# Patient Record
Sex: Male | Born: 1970 | Race: Black or African American | Hispanic: No | Marital: Married | Smoking: Never smoker
Health system: Southern US, Community
[De-identification: ages and names within clinical notes are randomized; demographics above are authoritative.]

## PROBLEM LIST (undated history)

## (undated) DIAGNOSIS — K589 Irritable bowel syndrome without diarrhea: Secondary | ICD-10-CM

## (undated) DIAGNOSIS — E119 Type 2 diabetes mellitus without complications: Secondary | ICD-10-CM

## (undated) DIAGNOSIS — I2510 Atherosclerotic heart disease of native coronary artery without angina pectoris: Secondary | ICD-10-CM

## (undated) DIAGNOSIS — I10 Essential (primary) hypertension: Secondary | ICD-10-CM

## (undated) HISTORY — PX: KNEE ARTHROSCOPY: SHX127

---

## 2008-05-15 ENCOUNTER — Ambulatory Visit: Payer: Self-pay | Admitting: Interventional Radiology

## 2008-05-15 ENCOUNTER — Emergency Department (HOSPITAL_BASED_OUTPATIENT_CLINIC_OR_DEPARTMENT_OTHER): Admission: EM | Admit: 2008-05-15 | Discharge: 2008-05-15 | Payer: Self-pay | Admitting: Emergency Medicine

## 2010-06-21 LAB — URINALYSIS, ROUTINE W REFLEX MICROSCOPIC
Bilirubin Urine: NEGATIVE
Nitrite: NEGATIVE
Specific Gravity, Urine: 1.014 (ref 1.005–1.030)
pH: 7.5 (ref 5.0–8.0)

## 2010-06-21 LAB — DIFFERENTIAL
Basophils Relative: 0 % (ref 0–1)
Lymphs Abs: 2 10*3/uL (ref 0.7–4.0)
Monocytes Relative: 7 % (ref 3–12)
Neutro Abs: 2.8 10*3/uL (ref 1.7–7.7)
Neutrophils Relative %: 52 % (ref 43–77)

## 2010-06-21 LAB — CBC
HCT: 48.6 % (ref 39.0–52.0)
MCV: 85 fL (ref 78.0–100.0)
Platelets: 232 10*3/uL (ref 150–400)
RDW: 13.1 % (ref 11.5–15.5)

## 2010-06-21 LAB — COMPREHENSIVE METABOLIC PANEL
BUN: 9 mg/dL (ref 6–23)
Calcium: 8.4 mg/dL (ref 8.4–10.5)
Glucose, Bld: 87 mg/dL (ref 70–99)
Total Protein: 7.5 g/dL (ref 6.0–8.3)

## 2010-06-21 LAB — URINE CULTURE: Colony Count: NO GROWTH

## 2014-10-12 ENCOUNTER — Encounter (HOSPITAL_BASED_OUTPATIENT_CLINIC_OR_DEPARTMENT_OTHER): Payer: Self-pay | Admitting: Emergency Medicine

## 2014-10-12 ENCOUNTER — Emergency Department (HOSPITAL_BASED_OUTPATIENT_CLINIC_OR_DEPARTMENT_OTHER)
Admission: EM | Admit: 2014-10-12 | Discharge: 2014-10-12 | Disposition: A | Discharge status: Home / Self Care | Payer: 59 | Attending: Emergency Medicine | Admitting: Emergency Medicine

## 2014-10-12 DIAGNOSIS — I2510 Atherosclerotic heart disease of native coronary artery without angina pectoris: Secondary | ICD-10-CM | POA: Diagnosis not present

## 2014-10-12 DIAGNOSIS — I10 Essential (primary) hypertension: Secondary | ICD-10-CM | POA: Diagnosis not present

## 2014-10-12 DIAGNOSIS — K0889 Other specified disorders of teeth and supporting structures: Secondary | ICD-10-CM

## 2014-10-12 DIAGNOSIS — Z79899 Other long term (current) drug therapy: Secondary | ICD-10-CM | POA: Diagnosis not present

## 2014-10-12 DIAGNOSIS — K088 Other specified disorders of teeth and supporting structures: Secondary | ICD-10-CM | POA: Diagnosis present

## 2014-10-12 DIAGNOSIS — K0381 Cracked tooth: Secondary | ICD-10-CM | POA: Insufficient documentation

## 2014-10-12 DIAGNOSIS — Z880 Allergy status to penicillin: Secondary | ICD-10-CM | POA: Diagnosis not present

## 2014-10-12 DIAGNOSIS — K029 Dental caries, unspecified: Secondary | ICD-10-CM | POA: Insufficient documentation

## 2014-10-12 HISTORY — DX: Essential (primary) hypertension: I10

## 2014-10-12 HISTORY — DX: Atherosclerotic heart disease of native coronary artery without angina pectoris: I25.10

## 2014-10-12 MED ORDER — CLINDAMYCIN HCL 150 MG PO CAPS
450.0000 mg | ORAL_CAPSULE | Freq: Once | ORAL | Status: AC
  Administered 2014-10-12: 450 mg via ORAL
  Filled 2014-10-12: qty 3

## 2014-10-12 MED ORDER — BUPIVACAINE HCL (PF) 0.25 % IJ SOLN
10.0000 mL | Freq: Once | INTRAMUSCULAR | Status: AC
  Administered 2014-10-12: 10 mL
  Filled 2014-10-12: qty 10

## 2014-10-12 MED ORDER — CLINDAMYCIN HCL 150 MG PO CAPS: 450.0000 mg | ORAL_CAPSULE | Freq: Three times a day (TID) | ORAL | Status: DC

## 2014-10-12 MED ORDER — BUPIVACAINE-EPINEPHRINE (PF) 0.5% -1:200000 IJ SOLN
INTRAMUSCULAR | Status: AC
  Filled 2014-10-12: qty 1.8

## 2014-10-12 MED ORDER — HYDROCODONE-ACETAMINOPHEN 5-325 MG PO TABS: 1.0000 | ORAL_TABLET | Freq: Four times a day (QID) | ORAL | Status: DC | PRN

## 2014-10-12 NOTE — Discharge Instructions (Signed)

## 2014-10-12 NOTE — ED Notes (Signed)
Left posterior lower tooth pain x 3 days

## 2014-10-12 NOTE — ED Provider Notes (Signed)
CSN: [id number]     Arrival date & time 10/12/14  0331 History   First MD Initiated Contact with Patient 10/12/14 0445     Chief Complaint  Patient presents with  . Dental Pain     Patient is a 44 y.o. male presenting with tooth pain. The history is provided by the patient. No language interpreter was used.  Dental Pain  [name] for evaluation of dental pain. He states that he's had throbbing pain in his lower left and upper left jaw for the last 3 days. He broke his teeth in that area about a year ago. The pain is constant and excruciating in nature. It is nonradiating. Denies any fevers, chest pain, shortness of breath, sore throat, difficulty swallowing, numbness, weakness. He plans to see his dentist later today.  Past Medical History  Diagnosis Date  . Coronary artery disease   . Hypertension    History reviewed. No pertinent past surgical history. History reviewed. No pertinent family history. History  Substance Use Topics  . Smoking status: Never Smoker   . Smokeless tobacco: Current User    Types: Chew  . Alcohol Use: No    Review of Systems  All other systems reviewed and are negative.     Allergies  Penicillins  Home Medications   Prior to Admission medications   Medication Sig Start Date End Date Taking? Authorizing Provider  ibuprofen (ADVIL,MOTRIN) 800 MG tablet Take 800 mg by mouth every 8 (eight) hours as needed.   Yes Historical Provider, MD  lisinopril (PRINIVIL,ZESTRIL) 20 MG tablet Take 20 mg by mouth daily.   Yes Historical Provider, MD  potassium chloride (KLOR-CON) 20 MEQ packet Take by mouth 2 (two) times daily.   Yes Historical Provider, MD  pravastatin (PRAVACHOL) 20 MG tablet Take 20 mg by mouth daily.   Yes Historical Provider, MD   BP 155/97 mmHg  Pulse 80  Temp(Src) 98.2 F (36.8 C) (Oral)  Resp 20  Ht 5\' 7"  (1.702 m)  Wt 220 lb (99.791 kg)  BMI 34.45 kg/m2  SpO2 96% Physical Exam  Constitutional: He is oriented to  person, place, and time. He appears well-developed and well-nourished.  HENT:  Head: Normocephalic and atraumatic.   Left TM obscured by cerumen. Right TM is clear. There is extensive dental decay with multiple fractured teeth at the base, predominantly in the left lower  Molars. There is mild gingival swelling in this area. There is no focal fluctuance. No trismus.  Cardiovascular: Normal rate and regular rhythm.   No murmur heard. Pulmonary/Chest: Effort normal. No respiratory distress.   No stridor  Neurological: He is alert and oriented to person, place, and time.  Skin: Skin is warm and dry.  Psychiatric: He has a normal mood and affect. His behavior is normal.  Nursing note and vitals reviewed.   ED Course  Procedures (including critical care time) NERVE BLOCK Performed by: Tilden Fossa Consent: Verbal consent obtained. Required items: required blood products, implants, devices, and special equipment available Time out: Immediately prior to procedure a "time out" was called to verify the correct patient, procedure, equipment, support staff and site/side marked as required.  Indication: dental pain Nerve block body site: left inferior alveolar nerve  Preparation: Patient was prepped and draped in the usual sterile fashion. Needle gauge: 27 G Location technique: anatomical landmarks  Local anesthetic: bupivicaine  Anesthetic total: 0.5 ml  Outcome: pain improved Patient tolerance: Patient tolerated the procedure well with no immediate complications.  Labs Review Labs Reviewed - No data to display  Imaging Review No results found.   EKG Interpretation None      MDM   Final diagnoses:  Pain, dental    patient here for evaluation of dental pain. History of presentation is not consistent with ACS or dissection. He does have poor dentition in the area of pain. Treating for possible developing dental abscess with clindamycin. Perform dental block with partial  improvement in his pain. Discussed dentistry follow-up as well as return precautions.    Tilden Fossa, MD 10/12/14 [phone]

## 2014-10-15 ENCOUNTER — Emergency Department (HOSPITAL_BASED_OUTPATIENT_CLINIC_OR_DEPARTMENT_OTHER)
Admission: EM | Admit: 2014-10-15 | Discharge: 2014-10-15 | Disposition: A | Discharge status: Home / Self Care | Payer: 59 | Attending: Emergency Medicine | Admitting: Emergency Medicine

## 2014-10-15 ENCOUNTER — Encounter (HOSPITAL_BASED_OUTPATIENT_CLINIC_OR_DEPARTMENT_OTHER): Payer: Self-pay

## 2014-10-15 DIAGNOSIS — K088 Other specified disorders of teeth and supporting structures: Secondary | ICD-10-CM | POA: Diagnosis not present

## 2014-10-15 DIAGNOSIS — K029 Dental caries, unspecified: Secondary | ICD-10-CM | POA: Insufficient documentation

## 2014-10-15 DIAGNOSIS — I10 Essential (primary) hypertension: Secondary | ICD-10-CM | POA: Insufficient documentation

## 2014-10-15 DIAGNOSIS — G8929 Other chronic pain: Secondary | ICD-10-CM

## 2014-10-15 DIAGNOSIS — I2510 Atherosclerotic heart disease of native coronary artery without angina pectoris: Secondary | ICD-10-CM | POA: Diagnosis not present

## 2014-10-15 DIAGNOSIS — Z79899 Other long term (current) drug therapy: Secondary | ICD-10-CM | POA: Insufficient documentation

## 2014-10-15 DIAGNOSIS — Z880 Allergy status to penicillin: Secondary | ICD-10-CM | POA: Diagnosis not present

## 2014-10-15 DIAGNOSIS — K089 Disorder of teeth and supporting structures, unspecified: Secondary | ICD-10-CM

## 2014-10-15 MED ORDER — HYDROCODONE-ACETAMINOPHEN 5-325 MG PO TABS: ORAL_TABLET | ORAL | Status: DC

## 2014-10-15 NOTE — ED Notes (Addendum)
Patient here for further evaluation of left sided dental pain. No relief with antibiotics and requesting additonal pain medication

## 2014-10-15 NOTE — ED Provider Notes (Signed)
CSN: [id number]     Arrival date & time 10/15/14  1247 History   First MD Initiated Contact with Patient 10/15/14 1247     Chief Complaint  Patient presents with  . Dental Pain     (Consider location/radiation/quality/duration/timing/severity/associated sxs/prior Treatment) HPI   Blood pressure 145/76, pulse 72, temperature 98.1 F (36.7 C), temperature source Oral, resp. rate 18, height  (1.702 m), weight 225 lb (102.059 kg), SpO2 96 %.  Richard Le is a 44 y.o. male complaining of persistent, 8 out of 10 left lower dental plane. Patient was seen here for similar but has ran out of his Vicodin. States he's been compliant with his clindamycin but states "that's not helping the pain." Denies fever/chills, difficulty opening jaw, difficulty swallowing, SOB, gum swelling, facial swelling, neck swelling chest pain, shortness of breath, diaphoresis, increasing peripheral edema, calf pain, leg swelling, orthopnea, PND.Marland Kitchen    Past Medical History  Diagnosis Date  . Coronary artery disease   . Hypertension    History reviewed. No pertinent past surgical history. No family history on file. History  Substance Use Topics  . Smoking status: Never Smoker   . Smokeless tobacco: Current User    Types: Chew  . Alcohol Use: No    Review of Systems  10 systems reviewed and found to be negative, except as noted in the HPI.   Allergies  Penicillins  Home Medications   Prior to Admission medications   Medication Sig Start Date End Date Taking? Authorizing Provider  clindamycin (CLEOCIN) 150 MG capsule Take 3 capsules (450 mg total) by mouth 3 (three) times daily. 10/12/14   Tilden Fossa, MD  HYDROcodone-acetaminophen (NORCO/VICODIN) 5-325 MG per tablet Take 1 tablet by mouth every 6 (six) hours as needed. 10/12/14   Tilden Fossa, MD  ibuprofen (ADVIL,MOTRIN) 800 MG tablet Take 800 mg by mouth every 8 (eight) hours as needed.    Historical Provider, MD  lisinopril (PRINIVIL,ZESTRIL) 20  MG tablet Take 20 mg by mouth daily.    Historical Provider, MD  potassium chloride (KLOR-CON) 20 MEQ packet Take by mouth 2 (two) times daily.    Historical Provider, MD  pravastatin (PRAVACHOL) 20 MG tablet Take 20 mg by mouth daily.    Historical Provider, MD   BP 145/76 mmHg  Pulse 72  Temp(Src) 98.1 F (36.7 C) (Oral)  Resp 18  Ht  (1.702 m)  Wt 225 lb (102.059 kg)  BMI 35.23 kg/m2  SpO2 96% Physical Exam  Constitutional: He is oriented to person, place, and time. He appears well-developed and well-nourished. No distress.  HENT:  Head: Normocephalic.  Mouth/Throat: Oropharynx is clear and moist.    Generally poor dentition, no gingival swelling, erythema or tenderness to palpation. Patient is handling their secretions. There is no tenderness to palpation or firmness underneath tongue bilaterally. No trismus.    Eyes: Conjunctivae and EOM are normal. Pupils are equal, round, and reactive to light.  Neck: Normal range of motion.  Cardiovascular: Normal rate.   Pulmonary/Chest: Effort normal. No stridor.  Abdominal: Soft.  Musculoskeletal: Normal range of motion.  Lymphadenopathy:    He has no cervical adenopathy.  Neurological: He is alert and oriented to person, place, and time.  Psychiatric: He has a normal mood and affect.  Nursing note and vitals reviewed.   ED Course  Procedures (including critical care time) Labs Review Labs Reviewed - No data to display  Imaging Review No results found.   EKG Interpretation None  MDM   Final diagnoses:  Chronic dental pain    Filed Vitals:   10/15/14 1252  BP: 145/76  Pulse: 72  Temp: 98.1 F (36.7 C)  TempSrc: Oral  Resp: 18  Height: 5\' 7"  (1.702 m)  Weight: 225 lb (102.059 kg)  SpO2: 96%    Richard Le is a pleasant 44 y.o. male presenting with dental pain. Patient seen for similar several days ago, he was given a prescription for clindamycin and Vicodin he has run out of Vicodin. He states he  clindamycin is not helping with the pain. He has an appointment to follow with a dentist in 3 days. His dental pain associated with dental caries but no signs or symptoms of dental abscess. Patient afebrile, non toxic appearing and swallowing secretions well. I gave patient referral to dentist and stressed the importance of dental follow up for definitive management of dental issues. Patient voices understanding and is agreeable to plan.  Evaluation does not show pathology that would require ongoing emergent intervention or inpatient treatment. Pt is hemodynamically stable and mentating appropriately. Discussed findings and plan with patient/guardian, who agrees with care plan. All questions answered. Return precautions discussed and outpatient follow up given.   New Prescriptions   HYDROCODONE-ACETAMINOPHEN (NORCO/VICODIN) 5-325 MG PER TABLET    Take 1-2 tablets by mouth every 6 hours as needed for pain and/or cough.         Wynetta Emery, PA-C 10/15/14 1302  Doug Sou, MD 10/15/14 [phone]

## 2014-10-15 NOTE — Discharge Instructions (Signed)
Take vicodin for breakthrough pain, do not drink alcohol, drive, care for children or do other critical tasks while taking vicodin. ° °Return to the emergency room for fever, change in vision, redness to the face that rapidly spreads towards the eye, nausea or vomiting, difficulty swallowing or shortness of breath. °  °Apply warm compresses to jaw throughout the day.  ° °Take your antibiotics as directed and to the end of the course.  ° °Followup with a dentist is very important for ongoing evaluation and management of recurrent dental pain. Return to emergency department for emergent changing or worsening symptoms." ° °Low-cost dental clinic: °**David  Civils  at [phone]**  °**Janna Civils at [phone] [address]**   ° °You may also call [phone] ° °Dental Assistance °If the dentist on-call cannot see you, please use the resources below: ° ° °Patients with Medicaid: [facility] °[address], [phone] °[address], [phone] ° °If unable to pay, or uninsured, contact [facility] ([phone]) or [facility] ([phone] in [place], [phone] in [place]) to become qualified for the adult dental clinic ° °Other Low-Cost Community Dental Services: °[facility]- [address], [place], [place], [address] °   [phone], Ext. [phone] °   2nd and 4th Thursday of the month at 6:30am °   10 clients each day by appointment, can sometimes see walk-in     patients if someone does not show for an appointment °[facility]- [address], [place], [place], [address] °   [phone] °[facility]- [address], [place], [place], [address] °   [phone] ° °[facility]- [phone] °[facility]- [phone] °[facility]- [phone] ° °

## 2015-02-04 ENCOUNTER — Emergency Department (HOSPITAL_BASED_OUTPATIENT_CLINIC_OR_DEPARTMENT_OTHER)
Admission: EM | Admit: 2015-02-04 | Discharge: 2015-02-04 | Disposition: A | Discharge status: Home / Self Care | Payer: 59 | Attending: Emergency Medicine | Admitting: Emergency Medicine

## 2015-02-04 ENCOUNTER — Encounter (HOSPITAL_BASED_OUTPATIENT_CLINIC_OR_DEPARTMENT_OTHER): Payer: Self-pay | Admitting: Adult Health

## 2015-02-04 ENCOUNTER — Emergency Department (HOSPITAL_BASED_OUTPATIENT_CLINIC_OR_DEPARTMENT_OTHER): Payer: 59

## 2015-02-04 DIAGNOSIS — J40 Bronchitis, not specified as acute or chronic: Secondary | ICD-10-CM

## 2015-02-04 DIAGNOSIS — M545 Low back pain: Secondary | ICD-10-CM | POA: Insufficient documentation

## 2015-02-04 DIAGNOSIS — Z79899 Other long term (current) drug therapy: Secondary | ICD-10-CM | POA: Insufficient documentation

## 2015-02-04 DIAGNOSIS — I10 Essential (primary) hypertension: Secondary | ICD-10-CM | POA: Diagnosis not present

## 2015-02-04 DIAGNOSIS — Z880 Allergy status to penicillin: Secondary | ICD-10-CM | POA: Insufficient documentation

## 2015-02-04 DIAGNOSIS — I2510 Atherosclerotic heart disease of native coronary artery without angina pectoris: Secondary | ICD-10-CM | POA: Diagnosis not present

## 2015-02-04 DIAGNOSIS — Z792 Long term (current) use of antibiotics: Secondary | ICD-10-CM | POA: Insufficient documentation

## 2015-02-04 DIAGNOSIS — R091 Pleurisy: Secondary | ICD-10-CM

## 2015-02-04 DIAGNOSIS — R05 Cough: Secondary | ICD-10-CM | POA: Diagnosis present

## 2015-02-04 MED ORDER — ALBUTEROL SULFATE HFA 108 (90 BASE) MCG/ACT IN AERS
2.0000 | INHALATION_SPRAY | RESPIRATORY_TRACT | Status: DC | PRN
  Administered 2015-02-04: 2 via RESPIRATORY_TRACT
  Filled 2015-02-04: qty 6.7

## 2015-02-04 MED ORDER — BENZONATATE 100 MG PO CAPS: 100.0000 mg | ORAL_CAPSULE | Freq: Three times a day (TID) | ORAL | Status: DC

## 2015-02-04 NOTE — ED Notes (Signed)
Pt ambulated on RA SPO2 94-98%. No distress noted. Pt back in room on pulse ox. spo2 99% HR 81. RR 20

## 2015-02-04 NOTE — ED Notes (Signed)
Presents with 2 days of cough, left back pain. Reports coughing up yellow and green phlegm and feeling hot flashes. "I believe I have pneumonia, it feels like it. I have had it before" breath sounds clear. Breathing easilyt and speaking in full sentences

## 2015-02-04 NOTE — ED Provider Notes (Signed)
CSN: [id number]     Arrival date & time 02/04/15  2003 History   First MD Initiated Contact with Patient 02/04/15 2053     Chief Complaint  Patient presents with  . Cough     (Consider location/radiation/quality/duration/timing/severity/associated sxs/prior Treatment) HPI Comments: Patient presents with complaint of cough and left-sided back pain for the past 3 days. Patient had a subjective fever several nights ago but no documented fevers. Cough is productive of yellow and green sputum. Pain is in left middle back and is worse with deep breathing and coughing. No URI symptoms. No wheezing reported. No nausea, vomiting, or diarrhea. Patient states that he has been on albuterol in the past but has never been diagnosed with pneumonia. No chest pains. Patient states that this current symptoms remind him of when he had pneumonia several years ago. Patient denies risk factors for pulmonary embolism including: unilateral leg swelling, history of DVT/PE/other blood clots, use of hormones, recent immobilizations, recent surgery, recent travel (>4hr segment), malignancy, hemoptysis.     Patient is a 44 y.o. male presenting with cough. The history is provided by the patient.  Cough Associated symptoms: no chest pain, no fever, no headaches, no myalgias, no rash, no rhinorrhea and no sore throat     Past Medical History  Diagnosis Date  . Coronary artery disease   . Hypertension    History reviewed. No pertinent past surgical history. History reviewed. No pertinent family history. Social History  Substance Use Topics  . Smoking status: Never Smoker   . Smokeless tobacco: Current User    Types: Chew  . Alcohol Use: No    Review of Systems  Constitutional: Negative for fever.  HENT: Negative for rhinorrhea and sore throat.   Eyes: Negative for redness.  Respiratory: Positive for cough.   Cardiovascular: Negative for chest pain.  Gastrointestinal: Negative for nausea, vomiting, abdominal  pain and diarrhea.  Genitourinary: Negative for dysuria.  Musculoskeletal: Positive for back pain (Left middle back). Negative for myalgias.  Skin: Negative for rash.  Neurological: Negative for headaches.      Allergies  Penicillins  Home Medications   Prior to Admission medications   Medication Sig Start Date End Date Taking? Authorizing Provider  clindamycin (CLEOCIN) 150 MG capsule Take 3 capsules (450 mg total) by mouth 3 (three) times daily. 10/12/14   Tilden FossaElizabeth Rees, MD  HYDROcodone-acetaminophen (NORCO/VICODIN) 5-325 MG per tablet Take 1 tablet by mouth every 6 (six) hours as needed. 10/12/14   Tilden FossaElizabeth Rees, MD  HYDROcodone-acetaminophen (NORCO/VICODIN) 5-325 MG per tablet Take 1-2 tablets by mouth every 6 hours as needed for pain and/or cough. 10/15/14   Nicole Pisciotta, PA-C  ibuprofen (ADVIL,MOTRIN) 800 MG tablet Take 800 mg by mouth every 8 (eight) hours as needed.    Historical Provider, MD  lisinopril (PRINIVIL,ZESTRIL) 20 MG tablet Take 20 mg by mouth daily.    Historical Provider, MD  potassium chloride (KLOR-CON) 20 MEQ packet Take by mouth 2 (two) times daily.    Historical Provider, MD  pravastatin (PRAVACHOL) 20 MG tablet Take 20 mg by mouth daily.    Historical Provider, MD   BP 158/102 mmHg  Pulse 85  Temp(Src) 98.7 F (37.1 C) (Oral)  Resp 18  Ht 5\' 7"  (1.702 m)  Wt 104.327 kg  BMI 36.01 kg/m2  SpO2 94% Physical Exam  Constitutional: He appears well-developed and well-nourished.  HENT:  Head: Normocephalic and atraumatic.  Right Ear: Hearing, tympanic membrane, external ear and ear canal normal.  Left  Ear: Hearing, tympanic membrane, external ear and ear canal normal.  Nose: Nose normal. No mucosal edema or rhinorrhea.  Mouth/Throat: Oropharynx is clear and moist.  Eyes: Conjunctivae are normal. Right eye exhibits no discharge. Left eye exhibits no discharge.  Neck: Normal range of motion. Neck supple.  Cardiovascular: Normal rate, regular rhythm and  normal heart sounds.   No murmur heard. Pulmonary/Chest: Effort normal and breath sounds normal.  Back pain is not reproducible tenderness.  Abdominal: Soft. There is no tenderness. There is no rebound and no guarding.  Musculoskeletal: He exhibits no edema or tenderness.  Neurological: He is alert.  Skin: Skin is warm and dry.  Psychiatric: He has a normal mood and affect.  Nursing note and vitals reviewed.   ED Course  Procedures (including critical care time) Labs Review Labs Reviewed - No data to display  Imaging Review Dg Chest 2 View  02/04/2015  CLINICAL DATA:  Two day history of cough, left back pain, hot flashes. EXAM: CHEST  2 VIEW COMPARISON:  09/20/2014 FINDINGS: Shallow inspiration with linear atelectasis in the lung bases. Normal heart size and pulmonary vascularity. No focal airspace disease or consolidation in the lungs. No blunting of costophrenic angles. No pneumothorax. Mediastinal contours appear intact. No significant change since previous study. IMPRESSION: Shallow inspiration with linear atelectasis in the lung bases. No evidence of active pulmonary disease. Electronically Signed   By: Burman Nieves M.D.   On: 02/04/2015 21:09   I have personally reviewed and evaluated these images and lab results as part of my medical decision-making.   EKG Interpretation None       9:37 PM Patient seen and examined. Medications ordered. Patient informed of x-ray results. He has pulse oximetry of 94-95% in the room. Will ambulate to make sure he doesn't desaturate. Low suspicion for PE or other etiology at this point. Will treat for bronchitis.  Vital signs reviewed and are as follows: BP 158/102 mmHg  Pulse 85  Temp(Src) 98.7 F (37.1 C) (Oral)  Resp 18  Ht  (1.702 m)  Wt 104.327 kg  BMI 36.01 kg/m2  SpO2 94%  10:00 PM patient has ambulated without decrease in oxygen saturation. Will discharge to home with treatment for bronchitis.  Encourage patient to  return with worsening shortness of breath, fever, blood in sputum, difficulty breathing or other concerns.  Patient counseled on use of albuterol HFA. Instructed to use 1-2 puffs q 4 hours as needed for SOB.   MDM   Final diagnoses:  Bronchitis  Pleurisy   Patient with cough, no documented fevers. Chest x-ray is clear. He describes pleuritic pain in left middle back. He does not have a significant risk factor profile for PE and is PERC negative. He is ambulatory without oxygen desaturation. He is in no respiratory distress. He has no chest pain or other complaints which suggest ACS. Feel treatment with conservative measures with albuterol, Tessalon indicated. Return instructions as above.    Renne Crigler, PA-C 02/04/15 2204  Mirian Mo, MD 02/04/15 2245

## 2015-02-04 NOTE — Discharge Instructions (Signed)
Please read and follow all provided instructions.  Your diagnoses today include:  1. Bronchitis   2. Pleurisy     Tests performed today include:  Chest x-ray - does not show any pneumonia  Vital signs. See below for your results today.   Medications prescribed:   Albuterol inhaler - medication that opens up your airway  Use inhaler as follows: 1-2 puffs with spacer every 4 hours as needed for wheezing, cough, or shortness of breath.    Tessalon Perles - cough suppressant medication  Take any prescribed medications only as directed.  Home care instructions:  Follow any educational materials contained in this packet.  Follow-up instructions: Please follow-up with your primary care provider in the next 3 days for further evaluation of your symptoms and a recheck if you are not feeling better.   Return instructions:   Please return to the Emergency Department if you experience worsening symptoms.  Please return with worsening wheezing, shortness of breath, or difficulty breathing.  Return with persistent fever above 101F.   Please return if you have any other emergent concerns.  Additional Information:  Your vital signs today were: BP 158/102 mmHg   Pulse 82   Temp(Src) 98.7 F (37.1 C) (Oral)   Resp 18   Ht 5\' 7"  (1.702 m)   Wt 104.327 kg   BMI 36.01 kg/m2   SpO2 96% If your blood pressure (BP) was elevated above 135/85 this visit, please have this repeated by your doctor within one month. --------------

## 2015-06-05 ENCOUNTER — Emergency Department (HOSPITAL_BASED_OUTPATIENT_CLINIC_OR_DEPARTMENT_OTHER)
Admission: EM | Admit: 2015-06-05 | Discharge: 2015-06-05 | Disposition: A | Discharge status: Home / Self Care | Payer: 59 | Attending: Emergency Medicine | Admitting: Emergency Medicine

## 2015-06-05 ENCOUNTER — Encounter (HOSPITAL_BASED_OUTPATIENT_CLINIC_OR_DEPARTMENT_OTHER): Payer: Self-pay | Admitting: *Deleted

## 2015-06-05 DIAGNOSIS — I10 Essential (primary) hypertension: Secondary | ICD-10-CM | POA: Diagnosis not present

## 2015-06-05 DIAGNOSIS — Z880 Allergy status to penicillin: Secondary | ICD-10-CM | POA: Insufficient documentation

## 2015-06-05 DIAGNOSIS — I2510 Atherosclerotic heart disease of native coronary artery without angina pectoris: Secondary | ICD-10-CM | POA: Insufficient documentation

## 2015-06-05 DIAGNOSIS — A084 Viral intestinal infection, unspecified: Secondary | ICD-10-CM | POA: Diagnosis not present

## 2015-06-05 DIAGNOSIS — Z79899 Other long term (current) drug therapy: Secondary | ICD-10-CM | POA: Diagnosis not present

## 2015-06-05 DIAGNOSIS — Z792 Long term (current) use of antibiotics: Secondary | ICD-10-CM | POA: Insufficient documentation

## 2015-06-05 DIAGNOSIS — R197 Diarrhea, unspecified: Secondary | ICD-10-CM | POA: Diagnosis present

## 2015-06-05 LAB — CBC WITH DIFFERENTIAL/PLATELET
Basophils Absolute: 0 10*3/uL (ref 0.0–0.1)
Basophils Relative: 0 %
Eosinophils Absolute: 0 10*3/uL (ref 0.0–0.7)
Eosinophils Relative: 1 %
HEMATOCRIT: 49.6 % (ref 39.0–52.0)
Hemoglobin: 17.1 g/dL — ABNORMAL HIGH (ref 13.0–17.0)
LYMPHS ABS: 0.6 10*3/uL — AB (ref 0.7–4.0)
LYMPHS PCT: 8 %
MCH: 28.5 pg (ref 26.0–34.0)
MCHC: 34.5 g/dL (ref 30.0–36.0)
MCV: 82.7 fL (ref 78.0–100.0)
MONO ABS: 0.2 10*3/uL (ref 0.1–1.0)
MONOS PCT: 3 %
NEUTROS ABS: 6.5 10*3/uL (ref 1.7–7.7)
Neutrophils Relative %: 88 %
Platelets: 197 10*3/uL (ref 150–400)
RBC: 6 MIL/uL — ABNORMAL HIGH (ref 4.22–5.81)
RDW: 16.2 % — AB (ref 11.5–15.5)
WBC: 7.4 10*3/uL (ref 4.0–10.5)

## 2015-06-05 LAB — COMPREHENSIVE METABOLIC PANEL
ALT: 38 U/L (ref 17–63)
AST: 32 U/L (ref 15–41)
Albumin: 4.4 g/dL (ref 3.5–5.0)
Alkaline Phosphatase: 88 U/L (ref 38–126)
Anion gap: 11 (ref 5–15)
BILIRUBIN TOTAL: 0.8 mg/dL (ref 0.3–1.2)
BUN: 17 mg/dL (ref 6–20)
CO2: 26 mmol/L (ref 22–32)
Calcium: 8.9 mg/dL (ref 8.9–10.3)
Chloride: 101 mmol/L (ref 101–111)
Creatinine, Ser: 1.08 mg/dL (ref 0.61–1.24)
GFR calc Af Amer: >60 mL/min (ref >60)
Glucose, Bld: 102 mg/dL — ABNORMAL HIGH (ref 65–99)
POTASSIUM: 3.7 mmol/L (ref 3.5–5.1)
Sodium: 138 mmol/L (ref 135–145)
TOTAL PROTEIN: 7.9 g/dL (ref 6.5–8.1)

## 2015-06-05 LAB — URINALYSIS, ROUTINE W REFLEX MICROSCOPIC
BILIRUBIN URINE: NEGATIVE
GLUCOSE, UA: NEGATIVE mg/dL
HGB URINE DIPSTICK: NEGATIVE
Ketones, ur: NEGATIVE mg/dL
Leukocytes, UA: NEGATIVE
Nitrite: NEGATIVE
Protein, ur: NEGATIVE mg/dL
SPECIFIC GRAVITY, URINE: 1.021 (ref 1.005–1.030)
pH: 6 (ref 5.0–8.0)

## 2015-06-05 LAB — LIPASE, BLOOD: LIPASE: 16 U/L (ref 11–51)

## 2015-06-05 MED ORDER — ONDANSETRON 4 MG PO TBDP: ORAL_TABLET | ORAL | Status: DC

## 2015-06-05 MED ORDER — SODIUM CHLORIDE 0.9 % IV BOLUS (SEPSIS)
1000.0000 mL | Freq: Once | INTRAVENOUS | Status: AC
  Administered 2015-06-05: 1000 mL via INTRAVENOUS

## 2015-06-05 MED ORDER — ONDANSETRON HCL 4 MG/2ML IJ SOLN
4.0000 mg | Freq: Once | INTRAMUSCULAR | Status: AC
  Administered 2015-06-05: 4 mg via INTRAVENOUS
  Filled 2015-06-05: qty 2

## 2015-06-05 NOTE — ED Provider Notes (Signed)
CSN: [id number]     Arrival date & time 06/05/15  1606 History   First MD Initiated Contact with Patient 06/05/15 1748     Chief Complaint  Patient presents with  . Emesis  . Diarrhea     (Consider location/radiation/quality/duration/timing/severity/associated sxs/prior Treatment) Patient is a 45 y.o. male presenting with vomiting and diarrhea. The history is provided by the patient. No language interpreter was used.  Emesis Severity:  Moderate Duration:  6 hours Timing:  Intermittent Quality:  Bilious material Chronicity:  New Ineffective treatments:  None tried Associated symptoms: abdominal pain and diarrhea   Associated symptoms: no chills and no fever   Risk factors: no prior abdominal surgery and no sick contacts   Diarrhea Associated symptoms: abdominal pain and vomiting   Associated symptoms: no chills and no fever     Past Medical History  Diagnosis Date  . Coronary artery disease   . Hypertension    History reviewed. No pertinent past surgical history. No family history on file. Social History  Substance Use Topics  . Smoking status: Never Smoker   . Smokeless tobacco: Current User    Types: Chew  . Alcohol Use: No    Review of Systems  Constitutional: Negative for fever and chills.  Gastrointestinal: Positive for vomiting, abdominal pain and diarrhea.  All other systems reviewed and are negative.     Allergies  Penicillins  Home Medications   Prior to Admission medications   Medication Sig Start Date End Date Taking? Authorizing Provider  benzonatate (TESSALON) 100 MG capsule Take 1 capsule (100 mg total) by mouth every 8 (eight) hours. 02/04/15   Renne Crigler, PA-C  clindamycin (CLEOCIN) 150 MG capsule Take 3 capsules (450 mg total) by mouth 3 (three) times daily. 10/12/14   Tilden Fossa, MD  HYDROcodone-acetaminophen (NORCO/VICODIN) 5-325 MG per tablet Take 1 tablet by mouth every 6 (six) hours as needed. 10/12/14   Tilden Fossa, MD   HYDROcodone-acetaminophen (NORCO/VICODIN) 5-325 MG per tablet Take 1-2 tablets by mouth every 6 hours as needed for pain and/or cough. 10/15/14   Nicole Pisciotta, PA-C  ibuprofen (ADVIL,MOTRIN) 800 MG tablet Take 800 mg by mouth every 8 (eight) hours as needed.    Historical Provider, MD  lisinopril (PRINIVIL,ZESTRIL) 20 MG tablet Take 20 mg by mouth daily.    Historical Provider, MD  potassium chloride (KLOR-CON) 20 MEQ packet Take by mouth 2 (two) times daily.    Historical Provider, MD  pravastatin (PRAVACHOL) 20 MG tablet Take 20 mg by mouth daily.    Historical Provider, MD   BP 144/92 mmHg  Pulse 110  Temp(Src) 98.4 F (36.9 C) (Oral)  Resp 18  Ht  (1.702 m)  Wt 104.327 kg  BMI 36.01 kg/m2  SpO2 95% Physical Exam  Constitutional: He is oriented to person, place, and time. He appears well-developed and well-nourished.  HENT:  Head: Normocephalic.  Eyes: Conjunctivae are normal.  Neck: Neck supple.  Cardiovascular: Normal rate and regular rhythm.   Pulmonary/Chest: Effort normal and breath sounds normal.  Abdominal: Bowel sounds are normal. He exhibits distension.  Musculoskeletal: He exhibits no edema or tenderness.  Lymphadenopathy:    He has no cervical adenopathy.  Neurological: He is alert and oriented to person, place, and time.  Skin: Skin is warm and dry.  Psychiatric: He has a normal mood and affect.  Nursing note and vitals reviewed.   ED Course  Procedures (including critical care time) Labs Review Labs Reviewed  CBC WITH DIFFERENTIAL/PLATELET -  Abnormal; Notable for the following:    RBC 6.00 (*)    Hemoglobin 17.1 (*)    RDW 16.2 (*)    Lymphs Abs 0.6 (*)    All other components within normal limits  COMPREHENSIVE METABOLIC PANEL - Abnormal; Notable for the following:    Glucose, Bld 102 (*)    All other components within normal limits  LIPASE, BLOOD  URINALYSIS, ROUTINE W REFLEX MICROSCOPIC (NOT [facility])    Imaging Review No results found. I  have personally reviewed and evaluated these images and lab results as part of my medical decision-making.   EKG Interpretation None     Patient feels better after IV fluids. Is tolerating po fluids without difficulty. Labs results reviewed and shared with patient. MDM   Final diagnoses:  None    Viral gastroenteritis. Symptomatic care instructions provided. Return precautions discussed.    Felicie Mornavid Amna Welker, NP 06/06/15 0121  Richardean Canalavid H Yao, MD 06/06/15 [phone]

## 2015-06-05 NOTE — ED Notes (Signed)
Vomiting and diarrhea x 5 hours.

## 2015-06-05 NOTE — ED Notes (Signed)
Pt verbalizes understanding of d/c instructions and denies any further needs at this time. 

## 2015-06-05 NOTE — Discharge Instructions (Signed)

## 2015-06-05 NOTE — ED Notes (Signed)
Pt sipping ginger ale and states he is ready to go home

## 2015-12-09 ENCOUNTER — Emergency Department (HOSPITAL_BASED_OUTPATIENT_CLINIC_OR_DEPARTMENT_OTHER): Payer: 59

## 2015-12-09 ENCOUNTER — Encounter (HOSPITAL_BASED_OUTPATIENT_CLINIC_OR_DEPARTMENT_OTHER): Payer: Self-pay | Admitting: Emergency Medicine

## 2015-12-09 ENCOUNTER — Emergency Department (HOSPITAL_BASED_OUTPATIENT_CLINIC_OR_DEPARTMENT_OTHER)
Admission: EM | Admit: 2015-12-09 | Discharge: 2015-12-09 | Disposition: A | Discharge status: Home / Self Care | Payer: 59 | Attending: Emergency Medicine | Admitting: Emergency Medicine

## 2015-12-09 DIAGNOSIS — I2510 Atherosclerotic heart disease of native coronary artery without angina pectoris: Secondary | ICD-10-CM | POA: Insufficient documentation

## 2015-12-09 DIAGNOSIS — I10 Essential (primary) hypertension: Secondary | ICD-10-CM | POA: Diagnosis not present

## 2015-12-09 DIAGNOSIS — F17220 Nicotine dependence, chewing tobacco, uncomplicated: Secondary | ICD-10-CM | POA: Diagnosis not present

## 2015-12-09 DIAGNOSIS — Z79899 Other long term (current) drug therapy: Secondary | ICD-10-CM | POA: Insufficient documentation

## 2015-12-09 DIAGNOSIS — J40 Bronchitis, not specified as acute or chronic: Secondary | ICD-10-CM | POA: Insufficient documentation

## 2015-12-09 DIAGNOSIS — R0602 Shortness of breath: Secondary | ICD-10-CM | POA: Diagnosis present

## 2015-12-09 MED ORDER — BENZONATATE 100 MG PO CAPS: 100.0000 mg | ORAL_CAPSULE | Freq: Three times a day (TID) | ORAL | 0 refills | Status: DC

## 2015-12-09 MED ORDER — AZITHROMYCIN 250 MG PO TABS: ORAL_TABLET | ORAL | 0 refills | Status: DC

## 2015-12-09 NOTE — ED Notes (Signed)
PA-C at bedside 

## 2015-12-09 NOTE — Discharge Instructions (Signed)
Please read and follow all provided instructions.  Your diagnoses today include:  1. Bronchitis    Tests performed today include:  Chest x-ray - does not show any pneumonia  Vital signs. See below for your results today.   Medications prescribed:   Azithromycin - antibiotic for respiratory infection  You have been prescribed an antibiotic medicine: take the entire course of medicine even if you are feeling better. Stopping early can cause the antibiotic not to work.   Tessalon Perles - cough suppressant medication  Take any prescribed medications only as directed.  Home care instructions:  Follow any educational materials contained in this packet.  Follow-up instructions: Please follow-up with your primary care provider in the next 3 days for further evaluation of your symptoms and a recheck if you are not feeling better.   Return instructions:   Please return to the Emergency Department if you experience worsening symptoms.  Please return with worsening wheezing, shortness of breath, or difficulty breathing.  Return with persistent fever above 101F.   Please return if you have any other emergent concerns.  Additional Information:  Your vital signs today were: BP 128/92 (BP Location: Right Arm)    Pulse 71    Temp 98 F (36.7 C) (Oral)    Resp 16    Ht 5\' 7"  (1.702 m)    Wt 99.8 kg    SpO2 95%    BMI 34.46 kg/m  If your blood pressure (BP) was elevated above 135/85 this visit, please have this repeated by your doctor within one month. --------------

## 2015-12-09 NOTE — ED Provider Notes (Signed)
[facility] Provider Note   CSN: [id number] Arrival date & time: 12/09/15  1729 By signing my name below, I, Bridgette Habermann, attest that this documentation has been prepared under the direction and in the presence of Renne Crigler, PA-C. Electronically Signed: Bridgette Habermann, ED Scribe. 12/09/15. 6:09 PM.  History   Chief Complaint Chief Complaint  Patient presents with  . Shortness of Breath   HPI Comments: Richard Le is a 45 y.o. male who presents to the Emergency Department complaining of rhinorrhea onset 6 days ago. He also has associated productive cough, shortness of breath, and subjective fever. Pt has sore throat secondary to his cough. Pt was seen at [facility] 5 days ago and had a CXR done that came back unremarkable. He diagnosed with sinus infection and given medications (Flonase, Afrin, Robitussin, Claritin) which has provided him no relief. He denies chest pain. No known sick contacts. Patient states that sinus symptoms are now resolved. He does have some lateral rib cage pain with coughing on the right side. Patient went to work today, shortness of breath did not preclude him from performing his activities.   The history is provided by the patient. No language interpreter was used.    Past Medical History:  Diagnosis Date  . Coronary artery disease   . Hypertension     There are no active problems to display for this patient.   History reviewed. No pertinent surgical history.     Home Medications    Prior to Admission medications   Medication Sig Start Date End Date Taking? Authorizing Provider  amlodipine-atorvastatin (CADUET) 10-10 MG tablet Take 1 tablet by mouth daily.   Yes Historical Provider, MD  fluticasone (VERAMYST) 27.5 MCG/SPRAY nasal spray Place 2 sprays into the nose daily.   Yes Historical Provider, MD  lisinopril (PRINIVIL,ZESTRIL) 20 MG tablet Take 20 mg by mouth daily.   Yes Historical Provider, MD  loratadine-pseudoephedrine (CLARITIN-D  12-HOUR) 5-120 MG tablet Take 1 tablet by mouth 2 (two) times daily.   Yes Historical Provider, MD  potassium chloride (KLOR-CON) 20 MEQ packet Take by mouth 2 (two) times daily.   Yes Historical Provider, MD  pravastatin (PRAVACHOL) 20 MG tablet Take 20 mg by mouth daily.   Yes Historical Provider, MD  benzonatate (TESSALON) 100 MG capsule Take 1 capsule (100 mg total) by mouth every 8 (eight) hours. 02/04/15   Renne Crigler, PA-C  clindamycin (CLEOCIN) 150 MG capsule Take 3 capsules (450 mg total) by mouth 3 (three) times daily. 10/12/14   Tilden Fossa, MD  HYDROcodone-acetaminophen (NORCO/VICODIN) 5-325 MG per tablet Take 1 tablet by mouth every 6 (six) hours as needed. 10/12/14   Tilden Fossa, MD  HYDROcodone-acetaminophen (NORCO/VICODIN) 5-325 MG per tablet Take 1-2 tablets by mouth every 6 hours as needed for pain and/or cough. 10/15/14   Nicole Pisciotta, PA-C  ibuprofen (ADVIL,MOTRIN) 800 MG tablet Take 800 mg by mouth every 8 (eight) hours as needed.    Historical Provider, MD  ondansetron (ZOFRAN ODT) 4 MG disintegrating tablet 4mg  ODT q6 hours prn nausea/vomit 06/05/15   Felicie Morn, NP    Family History No family history on file.  Social History Social History  Substance Use Topics  . Smoking status: Never Smoker  . Smokeless tobacco: Current User    Types: Chew  . Alcohol use No     Allergies   Penicillins   Review of Systems Review of Systems  Constitutional: Positive for fever (Subjective).  HENT: Positive for rhinorrhea and sore  throat.   Eyes: Negative for redness.  Respiratory: Positive for cough and shortness of breath.   Cardiovascular: Negative for chest pain.  Gastrointestinal: Negative for abdominal pain, diarrhea, nausea and vomiting.  Genitourinary: Negative for dysuria.  Musculoskeletal: Negative for myalgias.  Skin: Negative for rash.  Neurological: Negative for headaches.    Physical Exam Updated Vital Signs BP 140/91 (BP Location: Right Arm)    Pulse 95   Temp 98 F (36.7 C) (Oral)   Resp 18   Ht 5\' 7"  (1.702 m)   Wt 220 lb (99.8 kg)   SpO2 96%   BMI 34.46 kg/m   Physical Exam  Constitutional: He appears well-developed and well-nourished.  HENT:  Head: Normocephalic and atraumatic.  Right Ear: Tympanic membrane, external ear and ear canal normal.  Left Ear: Tympanic membrane, external ear and ear canal normal.  Nose: Nose normal. No mucosal edema or rhinorrhea.  Mouth/Throat: Uvula is midline and mucous membranes are normal. Mucous membranes are not dry. No trismus in the jaw. No uvula swelling. Posterior oropharyngeal erythema present. No oropharyngeal exudate, posterior oropharyngeal edema or tonsillar abscesses.  Eyes: Conjunctivae are normal. Right eye exhibits no discharge. Left eye exhibits no discharge.  Neck: Normal range of motion. Neck supple.  Cardiovascular: Normal rate, regular rhythm and normal heart sounds.   No murmur heard. Pulmonary/Chest: Effort normal and breath sounds normal. No respiratory distress. He has no wheezes. He has no rales. He exhibits tenderness (Mild, right lateral).  Abdominal: Soft. Bowel sounds are normal. He exhibits no distension. There is no tenderness.  Musculoskeletal: Normal range of motion.  Neurological: He is alert.  Skin: Skin is warm and dry.  Psychiatric: He has a normal mood and affect. His behavior is normal.  Nursing note and vitals reviewed.  ED Treatments / Results  DIAGNOSTIC STUDIES: Oxygen Saturation is 96% on RA, adequate by my interpretation.    COORDINATION OF CARE: 6:06 PM Discussed treatment plan with pt at bedside which includes chest x-ray and pt agreed to plan.  Radiology Dg Chest 2 View  Result Date: 12/09/2015 CLINICAL DATA:  Congestion for 5-6 days.  Nausea and vomiting. EXAM: CHEST  2 VIEW COMPARISON:  Chest x-rays dated 02/04/2015 and 09/20/2014. FINDINGS: Study is hypoinspiratory with crowding of the perihilar and bibasilar bronchovascular  markings. Given the low lung volumes, lungs are clear. No confluent opacity to suggest a developing pneumonia. No pleural effusion or pneumothorax seen. Heart size and mediastinal contours are normal. Osseous structures about the chest are unremarkable. IMPRESSION: Low lung volumes. No active cardiopulmonary disease. No evidence of pneumonia. Electronically Signed   By: Bary Richard M.D.   On: 12/09/2015 18:21    Procedures Procedures (including critical care time)  Medications Ordered in ED Medications - No data to display   Initial Impression / Assessment and Plan / ED Course  I have reviewed the triage vital signs and the nursing notes.  Pertinent labs & imaging results that were available during my care of the patient were reviewed by me and considered in my medical decision making (see chart for details).  Clinical Course   Patient seen and examined. Given that he does have shortness of breath that is worse, will perform chest x-ray to evaluate for developing pneumonia.  Vital signs reviewed and are as follows: BP 128/92 (BP Location: Right Arm)   Pulse 71   Temp 98 F (36.7 C) (Oral)   Resp 16   Ht 5\' 7"  (1.702 m)   Wt  99.8 kg   SpO2 95%   BMI 34.46 kg/m   Chest x-ray is negative. Will give course of azithromycin: I've asked the patient to hold off filling this for the next 48 hours. He will not fill if he starts to feel better. Otherwise, take full course is not feeling better on Monday. Also Tessalon for cough. Patient can take Robitussin if he desires.  Return with worsening chest pain, worsening shortness of breath, fever, blood noted in sputum, or other concerns. He verbalizes understanding and agrees with plan.  Final Clinical Impressions(s) / ED Diagnoses   Final diagnoses:  Bronchitis   Patient with constellation of symptoms that is likely viral. Earlier this week he had what sounds like a sinusitis that is now resolved. Now main complaint is cough and sore  throat from cough. Patient has some mild chest wall tenderness that is reproducible palpation and coughing. No abnormal vital signs. No symptoms that are concerning for angina. Do not feel that cardiac workup is indicated at this point getting clearly infectious etiology. Patient appears well, nontoxic. No hypoxia. Feel safe for discharge home.  I personally performed the services described in this documentation, which was scribed in my presence. The recorded information has been reviewed and is accurate.   New Prescriptions Discharge Medication List as of 12/09/2015  7:02 PM    START taking these medications   Details  azithromycin (ZITHROMAX Z-PAK) 250 MG tablet Take two tablets PO on day 1 and one tablet PO days 2-5, Print         Renne CriglerJoshua Charvi Gammage, PA-C 12/09/15 1929    Laurence Spatesachel Morgan Little, MD 12/09/15 2056

## 2015-12-09 NOTE — ED Triage Notes (Addendum)
[facility].  States medications sent home with are not helping.  Pt c/o SOB and cough.  Lung sounds diminished, no course lung sounds or wheezing heard in triage.

## 2017-12-23 ENCOUNTER — Emergency Department (HOSPITAL_BASED_OUTPATIENT_CLINIC_OR_DEPARTMENT_OTHER)
Admission: EM | Admit: 2017-12-23 | Discharge: 2017-12-23 | Disposition: A | Discharge status: Home / Self Care | Payer: 59 | Attending: Emergency Medicine | Admitting: Emergency Medicine

## 2017-12-23 ENCOUNTER — Encounter (HOSPITAL_BASED_OUTPATIENT_CLINIC_OR_DEPARTMENT_OTHER): Payer: Self-pay | Admitting: Emergency Medicine

## 2017-12-23 ENCOUNTER — Other Ambulatory Visit: Payer: Self-pay

## 2017-12-23 DIAGNOSIS — R109 Unspecified abdominal pain: Secondary | ICD-10-CM | POA: Diagnosis present

## 2017-12-23 DIAGNOSIS — I10 Essential (primary) hypertension: Secondary | ICD-10-CM | POA: Diagnosis not present

## 2017-12-23 DIAGNOSIS — Z79899 Other long term (current) drug therapy: Secondary | ICD-10-CM | POA: Insufficient documentation

## 2017-12-23 DIAGNOSIS — R1084 Generalized abdominal pain: Secondary | ICD-10-CM | POA: Insufficient documentation

## 2017-12-23 DIAGNOSIS — F17220 Nicotine dependence, chewing tobacco, uncomplicated: Secondary | ICD-10-CM | POA: Insufficient documentation

## 2017-12-23 DIAGNOSIS — I2510 Atherosclerotic heart disease of native coronary artery without angina pectoris: Secondary | ICD-10-CM | POA: Diagnosis not present

## 2017-12-23 LAB — COMPREHENSIVE METABOLIC PANEL
ALT: 31 U/L (ref 0–44)
AST: 24 U/L (ref 15–41)
Albumin: 4.1 g/dL (ref 3.5–5.0)
Alkaline Phosphatase: 74 U/L (ref 38–126)
Anion gap: 9 (ref 5–15)
BILIRUBIN TOTAL: 0.5 mg/dL (ref 0.3–1.2)
BUN: 16 mg/dL (ref 6–20)
CO2: 26 mmol/L (ref 22–32)
CREATININE: 0.91 mg/dL (ref 0.61–1.24)
Calcium: 9 mg/dL (ref 8.9–10.3)
Chloride: 102 mmol/L (ref 98–111)
GFR calc Af Amer: >60 mL/min (ref >60)
GFR calc non Af Amer: >60 mL/min (ref >60)
Glucose, Bld: 84 mg/dL (ref 70–99)
POTASSIUM: 3.5 mmol/L (ref 3.5–5.1)
Sodium: 137 mmol/L (ref 135–145)
Total Protein: 7.4 g/dL (ref 6.5–8.1)

## 2017-12-23 LAB — URINALYSIS, ROUTINE W REFLEX MICROSCOPIC
BILIRUBIN URINE: NEGATIVE
GLUCOSE, UA: NEGATIVE mg/dL
Hgb urine dipstick: NEGATIVE
KETONES UR: NEGATIVE mg/dL
Leukocytes, UA: NEGATIVE
Nitrite: NEGATIVE
Protein, ur: NEGATIVE mg/dL
SPECIFIC GRAVITY, URINE: 1.02 (ref 1.005–1.030)
pH: 6 (ref 5.0–8.0)

## 2017-12-23 LAB — CBC
HCT: 46.3 % (ref 39.0–52.0)
HEMOGLOBIN: 14.9 g/dL (ref 13.0–17.0)
MCH: 27.7 pg (ref 26.0–34.0)
MCHC: 32.2 g/dL (ref 30.0–36.0)
MCV: 86.1 fL (ref 80.0–100.0)
Platelets: 195 10*3/uL (ref 150–400)
RBC: 5.38 MIL/uL (ref 4.22–5.81)
RDW: 15 % (ref 11.5–15.5)
WBC: 6.3 10*3/uL (ref 4.0–10.5)
nRBC: 0 % (ref 0.0–0.2)

## 2017-12-23 LAB — LIPASE, BLOOD: Lipase: 28 U/L (ref 11–51)

## 2017-12-23 MED ORDER — DICYCLOMINE HCL 10 MG PO CAPS
10.0000 mg | ORAL_CAPSULE | Freq: Once | ORAL | Status: AC
  Administered 2017-12-23: 10 mg via ORAL
  Filled 2017-12-23: qty 1

## 2017-12-23 MED ORDER — DICYCLOMINE HCL 10 MG PO CAPS: 10.0000 mg | ORAL_CAPSULE | Freq: Three times a day (TID) | ORAL | 0 refills | Status: DC

## 2017-12-23 NOTE — ED Provider Notes (Signed)
[facility] EMERGENCY DEPARTMENT Provider Note   CSN: [id number] Arrival date & time: 12/23/17  1751     History   Chief Complaint Chief Complaint  Patient presents with  . Abdominal Pain    HPI Richard Le is a 47 y.o. male.  The history is provided by the patient. No language interpreter was used.  Abdominal Pain     Richard Le is a 47 y.o. male who presents to the Emergency Department complaining of abdominal pain. He presents to the emergency department complaining of abdominal bloating and pain for the last two days. He feels like he has excess gas and has been experiencing frequent belching as well as loose stools. He has pain to both silence of his lower abdomen. No fevers, nausea, vomiting, dysuria. No known bad food exposures or sick contacts. No prior abdominal surgeries. No prior similar symptoms. He does have a history of IBS. Past Medical History:  Diagnosis Date  . Coronary artery disease   . Hypertension     There are no active problems to display for this patient.   History reviewed. No pertinent surgical history.      Home Medications    Prior to Admission medications   Medication Sig Start Date End Date Taking? Authorizing Provider  amlodipine-atorvastatin (CADUET) 10-10 MG tablet Take 1 tablet by mouth daily.    [provider]  azithromycin (ZITHROMAX Z-PAK) 250 MG tablet Take two tablets PO on day 1 and one tablet PO days 2-5 12/09/15   Renne Crigler, PA-C  benzonatate (TESSALON) 100 MG capsule Take 1 capsule (100 mg total) by mouth every 8 (eight) hours. 12/09/15   Renne Crigler, PA-C  clindamycin (CLEOCIN) 150 MG capsule Take 3 capsules (450 mg total) by mouth 3 (three) times daily. 10/12/14   Tilden Fossa, MD  dicyclomine (BENTYL) 10 MG capsule Take 1 capsule (10 mg total) by mouth 4 (four) times daily -  before meals and at bedtime. 12/23/17   Tilden Fossa, MD  fluticasone (VERAMYST) 27.5 MCG/SPRAY nasal spray  Place 2 sprays into the nose daily.    [provider]  HYDROcodone-acetaminophen (NORCO/VICODIN) 5-325 MG per tablet Take 1 tablet by mouth every 6 (six) hours as needed. 10/12/14   Tilden Fossa, MD  HYDROcodone-acetaminophen (NORCO/VICODIN) 5-325 MG per tablet Take 1-2 tablets by mouth every 6 hours as needed for pain and/or cough. 10/15/14   Pisciotta, Joni Reining, PA-C  ibuprofen (ADVIL,MOTRIN) 800 MG tablet Take 800 mg by mouth every 8 (eight) hours as needed.    [provider]  lisinopril (PRINIVIL,ZESTRIL) 20 MG tablet Take 20 mg by mouth daily.    [provider]  loratadine-pseudoephedrine (CLARITIN-D 12-HOUR) 5-120 MG tablet Take 1 tablet by mouth 2 (two) times daily.    [provider]  ondansetron (ZOFRAN ODT) 4 MG disintegrating tablet 4mg  ODT q6 hours prn nausea/vomit 06/05/15   Felicie Morn, NP  potassium chloride (KLOR-CON) 20 MEQ packet Take by mouth 2 (two) times daily.    [provider]  pravastatin (PRAVACHOL) 20 MG tablet Take 20 mg by mouth daily.    [provider]    Family History History reviewed. No pertinent family history.  Social History Social History   Tobacco Use  . Smoking status: Never Smoker  . Smokeless tobacco: Current User    Types: Chew  Substance Use Topics  . Alcohol use: No  . Drug use: Not on file     Allergies   Penicillins   Review  of Systems Review of Systems  Gastrointestinal: Positive for abdominal pain.  All other systems reviewed and are negative.    Physical Exam Updated Vital Signs BP 139/88 (BP Location: Right Arm)   Pulse 64   Temp 98 F (36.7 C) (Oral)   Resp 18   Ht 5\' 7"  (1.702 m)   Wt 101.2 kg   SpO2 98%   BMI 34.93 kg/m   Physical Exam  Constitutional: He is oriented to person, place, and time. He appears well-developed and well-nourished.  HENT:  Head: Normocephalic and atraumatic.  Cardiovascular: Normal rate and regular rhythm.  No murmur  heard. Pulmonary/Chest: Effort normal and breath sounds normal. No respiratory distress.  Abdominal: Soft. Bowel sounds are normal. There is no rebound and no guarding.  Minimal abdominal distention. Mild generalized abdominal tenderness without guarding or rebound.  Musculoskeletal: He exhibits no edema or tenderness.  Neurological: He is alert and oriented to person, place, and time.  Skin: Skin is warm and dry.  Psychiatric: He has a normal mood and affect. His behavior is normal.  Nursing note and vitals reviewed.    ED Treatments / Results  Labs (all labs ordered are listed, but only abnormal results are displayed) Labs Reviewed  LIPASE, BLOOD  COMPREHENSIVE METABOLIC PANEL  CBC  URINALYSIS, ROUTINE W REFLEX MICROSCOPIC    EKG None  Radiology No results found.  Procedures Procedures (including critical care time)  Medications Ordered in ED Medications  dicyclomine (BENTYL) capsule 10 mg (10 mg Oral Given 12/23/17 2042)     Initial Impression / Assessment and Plan / ED Course  I have reviewed the triage vital signs and the nursing notes.  Pertinent labs & imaging results that were available during my care of the patient were reviewed by me and considered in my medical decision making (see chart for details).     Patient here for evaluation of abdominal bloating, loose stools. He has minimal tenderness on examination. Presentation is not consistent with bowel obstruction, acute appendicitis, perforated viscous, cholecystitis. Counseled patient on home care for abdominal bloating and discomfort. Discussed importance of outpatient follow-up as well as return precautions. Recommend repeat abdominal examination in the next 12 to 24 hours if he does have ongoing symptoms.  Final Clinical Impressions(s) / ED Diagnoses   Final diagnoses:  Abdominal cramping    ED Discharge Orders         Ordered    dicyclomine (BENTYL) 10 MG capsule  3 times daily before meals &  bedtime     12/23/17 2113           Tilden Fossa, MD 12/23/17 2256

## 2017-12-23 NOTE — ED Triage Notes (Signed)
Reports generalized abdominal pain with diarrhea.  States he feels like his stomach is distended.  Denies vomiting, dysuria.

## 2017-12-23 NOTE — Discharge Instructions (Addendum)
Plenty of fluids. Get rechecked immediately if you have worsening abdominal pain, fevers, vomiting or new concerning symptoms. Get re-evaluated with a repeat abdominal exam if you have ongoing pain for the next 12 to 24 hours.

## 2018-08-05 ENCOUNTER — Emergency Department (HOSPITAL_BASED_OUTPATIENT_CLINIC_OR_DEPARTMENT_OTHER)
Admission: EM | Admit: 2018-08-05 | Discharge: 2018-08-05 | Disposition: A | Discharge status: Home / Self Care | Payer: 59 | Attending: Emergency Medicine | Admitting: Emergency Medicine

## 2018-08-05 ENCOUNTER — Encounter (HOSPITAL_BASED_OUTPATIENT_CLINIC_OR_DEPARTMENT_OTHER): Payer: Self-pay | Admitting: *Deleted

## 2018-08-05 ENCOUNTER — Other Ambulatory Visit: Payer: Self-pay

## 2018-08-05 DIAGNOSIS — I10 Essential (primary) hypertension: Secondary | ICD-10-CM | POA: Diagnosis not present

## 2018-08-05 DIAGNOSIS — I2510 Atherosclerotic heart disease of native coronary artery without angina pectoris: Secondary | ICD-10-CM | POA: Insufficient documentation

## 2018-08-05 DIAGNOSIS — R002 Palpitations: Secondary | ICD-10-CM | POA: Diagnosis not present

## 2018-08-05 DIAGNOSIS — F17220 Nicotine dependence, chewing tobacco, uncomplicated: Secondary | ICD-10-CM | POA: Diagnosis not present

## 2018-08-05 LAB — CBC WITH DIFFERENTIAL/PLATELET
Abs Immature Granulocytes: 0.01 10*3/uL (ref 0.00–0.07)
Basophils Absolute: 0 10*3/uL (ref 0.0–0.1)
Basophils Relative: 0 %
Eosinophils Absolute: 0.1 10*3/uL (ref 0.0–0.5)
Eosinophils Relative: 2 %
HCT: 43.4 % (ref 39.0–52.0)
Hemoglobin: 13.9 g/dL (ref 13.0–17.0)
Immature Granulocytes: 0 %
Lymphocytes Relative: 57 %
Lymphs Abs: 4 10*3/uL (ref 0.7–4.0)
MCH: 27.6 pg (ref 26.0–34.0)
MCHC: 32 g/dL (ref 30.0–36.0)
MCV: 86.1 fL (ref 80.0–100.0)
Monocytes Absolute: 0.4 10*3/uL (ref 0.1–1.0)
Monocytes Relative: 6 %
Neutro Abs: 2.4 10*3/uL (ref 1.7–7.7)
Neutrophils Relative %: 35 %
Platelets: 180 10*3/uL (ref 150–400)
RBC: 5.04 MIL/uL (ref 4.22–5.81)
RDW: 15.3 % (ref 11.5–15.5)
WBC: 7 10*3/uL (ref 4.0–10.5)
nRBC: 0 % (ref 0.0–0.2)

## 2018-08-05 LAB — BASIC METABOLIC PANEL
Anion gap: 7 (ref 5–15)
BUN: 17 mg/dL (ref 6–20)
CO2: 25 mmol/L (ref 22–32)
Calcium: 8.4 mg/dL — ABNORMAL LOW (ref 8.9–10.3)
Chloride: 105 mmol/L (ref 98–111)
Creatinine, Ser: 1 mg/dL (ref 0.61–1.24)
GFR calc Af Amer: >60 mL/min (ref >60)
GFR calc non Af Amer: >60 mL/min (ref >60)
Glucose, Bld: 166 mg/dL — ABNORMAL HIGH (ref 70–99)
Potassium: 3.6 mmol/L (ref 3.5–5.1)
Sodium: 137 mmol/L (ref 135–145)

## 2018-08-05 LAB — MAGNESIUM: Magnesium: 2 mg/dL (ref 1.7–2.4)

## 2018-08-05 LAB — TROPONIN I: Troponin I: <0.03 ng/mL (ref <0.03)

## 2018-08-05 MED ORDER — SODIUM CHLORIDE 0.9 % IV BOLUS
500.0000 mL | Freq: Once | INTRAVENOUS | Status: AC
  Administered 2018-08-05: 500 mL via INTRAVENOUS

## 2018-08-05 NOTE — ED Provider Notes (Signed)
Emergency Department Provider Note   I have reviewed the triage vital signs and the nursing notes.   HISTORY  Chief Complaint Palpitations   HPI Richard Le is a 48 y.o. male with PMH of CAD and HTN presents to the emergency department for evaluation of acute onset heart palpitations.  Patient states that symptoms began abruptly this evening without obvious provoking factor.  He denies any chest pain, shortness of breath, lightheadedness.  Symptoms lasted for approximately 2 minutes and resolve spontaneously.  He has no prior history of similar.  He is not taking any new medications, herbal supplements.  He drank a small amount of alcohol earlier in the day which is not unusual for him.  He denies using any illicit drugs.  No radiation of symptoms or other modifying factors.  He is not experiencing any symptoms at this time.  Past Medical History:  Diagnosis Date  . Coronary artery disease   . Hypertension     There are no active problems to display for this patient.   History reviewed. No pertinent surgical history.  Allergies Penicillins  History reviewed. No pertinent family history.  Social History Social History   Tobacco Use  . Smoking status: Never Smoker  . Smokeless tobacco: Current User    Types: Chew  Substance Use Topics  . Alcohol use: No  . Drug use: Not Currently    Review of Systems  Constitutional: No fever/chills Eyes: No visual changes. ENT: No sore throat. Cardiovascular: Denies chest pain. Positive palpitations.  Respiratory: Denies shortness of breath. Gastrointestinal: No abdominal pain.  No nausea, no vomiting.  No diarrhea.  No constipation. Genitourinary: Negative for dysuria. Musculoskeletal: Negative for back pain. Skin: Negative for rash. Neurological: Negative for headaches, focal weakness or numbness.  10-point ROS otherwise negative.  ____________________________________________   PHYSICAL EXAM:  VITAL SIGNS: ED  Triage Vitals  Enc Vitals Group     BP 08/05/18 2050 (!) 169/110     Pulse Rate 08/05/18 2050 (!) 110     Resp 08/05/18 2050 18     Temp 08/05/18 2050 98.8 F (37.1 C)     Temp Source 08/05/18 2050 Oral     SpO2 08/05/18 2050 98 %     Weight 08/05/18 2051 230 lb (104.3 kg)     Height 08/05/18 2051 5\' 7"  (1.702 m)   Constitutional: Alert and oriented. Well appearing and in no acute distress. Eyes: Conjunctivae are normal.  Head: Atraumatic. Nose: No congestion/rhinnorhea. Mouth/Throat: Mucous membranes are moist.   Neck: No stridor.   Cardiovascular: Tachycardia. Good peripheral circulation. Grossly normal heart sounds.   Respiratory: Normal respiratory effort.  No retractions. Lungs CTAB. Gastrointestinal: Soft and nontender. No distention.  Musculoskeletal: No lower extremity tenderness nor edema. No gross deformities of extremities. Neurologic:  Normal speech and language. No gross focal neurologic deficits are appreciated.  Skin:  Skin is warm, dry and intact. No rash noted. Keloid over the anterior chest wall.    ____________________________________________   LABS (all labs ordered are listed, but only abnormal results are displayed)  Labs Reviewed  BASIC METABOLIC PANEL - Abnormal; Notable for the following components:      Result Value   Glucose, Bld 166 (*)    Calcium 8.4 (*)    All other components within normal limits  CBC WITH DIFFERENTIAL/PLATELET  TROPONIN I  MAGNESIUM   ____________________________________________  EKG   EKG Interpretation  Date/Time:  Wednesday Aug 05 2018 20:57:33 EDT Ventricular Rate:  106 PR  Interval:    QRS Duration: 90 QT Interval:  323 QTC Calculation: 429 R Axis:   34 Text Interpretation:  Sinus tachycardia Borderline T abnormalities, inferior leads No STEMI.  Similar to prior.  Confirmed by Alona Bene [phone]) on 08/05/2018 9:23:00 PM Also confirmed by Alona Bene [phone]), editor Barbette Hair [phone])  on 08/06/2018 7:01:50  AM       ____________________________________________  RADIOLOGY  None ____________________________________________   PROCEDURES  Procedure(s) performed:   Procedures  None ____________________________________________   INITIAL IMPRESSION / ASSESSMENT AND PLAN / ED COURSE  Pertinent labs & imaging results that were available during my care of the patient were reviewed by me and considered in my medical decision making (see chart for details).   Patient presents to the emergency department for evaluation of acute onset heart palpitations which resolved after 2 to 3 minutes.  Heart rate here shows sinus tachycardia.  No hypotension.  No significant electrolyte abnormality.  Clinically had symptoms to suspect hyperthyroid.  No clear etiology for symptoms at this time.  Plan for outpatient PCP follow-up.  Will give contact information for cardiology.  Advised the patient to call tomorrow to schedule an appointment in the coming week.  Patient may require ambulatory monitoring to assess for underlying arrhythmia.  Labs without abnormality. Tachycardia improved. No arrhythmia on tele in the ED. Advised PCP and Cardiology follow up. Discussed ED return precautions.  ____________________________________________  FINAL CLINICAL IMPRESSION(S) / ED DIAGNOSES  Final diagnoses:  Palpitations     MEDICATIONS GIVEN DURING THIS VISIT:  Medications  sodium chloride 0.9 % bolus 500 mL (0 mLs Intravenous Stopped 08/05/18 2153)     Note:  This document was prepared using Dragon voice recognition software and may include unintentional dictation errors.  Alona Bene, MD Emergency Medicine    , Arlyss Repress, MD 08/06/18 [phone]

## 2018-08-05 NOTE — Discharge Instructions (Signed)
You were seen in the emergency department today with heart palpitations.  Your labs today are normal.  Try and drink plenty of water and avoid caffeine and/or alcohol if possible.  Please call the cardiologist listed to schedule an outpatient appointment.  Return to the emergency department with any new or suddenly worsening symptoms.

## 2018-08-05 NOTE — ED Triage Notes (Signed)
Pt c/o palpitations last 2 mins

## 2019-04-10 ENCOUNTER — Encounter (HOSPITAL_BASED_OUTPATIENT_CLINIC_OR_DEPARTMENT_OTHER): Payer: Self-pay | Admitting: Emergency Medicine

## 2019-04-10 ENCOUNTER — Emergency Department (HOSPITAL_BASED_OUTPATIENT_CLINIC_OR_DEPARTMENT_OTHER)
Admission: EM | Admit: 2019-04-10 | Discharge: 2019-04-10 | Disposition: A | Discharge status: Home / Self Care | Payer: 59 | Attending: Emergency Medicine | Admitting: Emergency Medicine

## 2019-04-10 ENCOUNTER — Other Ambulatory Visit: Payer: Self-pay

## 2019-04-10 DIAGNOSIS — R0981 Nasal congestion: Secondary | ICD-10-CM | POA: Diagnosis present

## 2019-04-10 DIAGNOSIS — Z79899 Other long term (current) drug therapy: Secondary | ICD-10-CM | POA: Insufficient documentation

## 2019-04-10 DIAGNOSIS — I10 Essential (primary) hypertension: Secondary | ICD-10-CM | POA: Diagnosis not present

## 2019-04-10 DIAGNOSIS — I259 Chronic ischemic heart disease, unspecified: Secondary | ICD-10-CM | POA: Insufficient documentation

## 2019-04-10 DIAGNOSIS — J0101 Acute recurrent maxillary sinusitis: Secondary | ICD-10-CM | POA: Insufficient documentation

## 2019-04-10 HISTORY — DX: Irritable bowel syndrome, unspecified: K58.9

## 2019-04-10 MED ORDER — DOXYCYCLINE HYCLATE 100 MG PO TABS
100.0000 mg | ORAL_TABLET | Freq: Once | ORAL | Status: AC
  Administered 2019-04-10: 100 mg via ORAL
  Filled 2019-04-10: qty 1

## 2019-04-10 MED ORDER — DOXYCYCLINE HYCLATE 100 MG PO CAPS: 100.0000 mg | ORAL_CAPSULE | Freq: Two times a day (BID) | ORAL | 0 refills | Status: DC

## 2019-04-10 MED ORDER — FLUTICASONE FUROATE 27.5 MCG/SPRAY NA SUSP: NASAL | 0 refills | Status: AC

## 2019-04-10 NOTE — ED Provider Notes (Signed)
[facility] Provider Note: Richard Dell, MD, FACEP  CSN: [id number] MRN: [id number] ARRIVAL: 04/10/19 at 0627 ROOM: MH01/[facility]01   CHIEF COMPLAINT  Sinus Problem   HISTORY OF PRESENT ILLNESS  04/10/19 6:40 AM Richard Le is a 49 y.o. male who complains of several days of sinus pressure and drainage of thick yellow mucus.  Symptoms are consistent with previous sinus infections.  He has also had some intermittent bleeding which has not been severe.  He denies pain but does have some discomfort on the sides of his nose.  He denies any fever, sore throat or other viral symptoms.  He has not used anything to treat this.   Past Medical History:  Diagnosis Date  . Coronary artery disease   . Hypertension   . IBS (irritable bowel syndrome)     History reviewed. No pertinent surgical history.  No family history on file.  Social History   Tobacco Use  . Smoking status: Never Smoker  . Smokeless tobacco: Current User    Types: Chew  Substance Use Topics  . Alcohol use: No  . Drug use: Not Currently    Prior to Admission medications   Medication Sig Start Date End Date Taking? Authorizing Provider  amlodipine-atorvastatin (CADUET) 10-10 MG tablet Take 1 tablet by mouth daily.    [provider]  doxycycline (VIBRAMYCIN) 100 MG capsule Take 1 capsule (100 mg total) by mouth 2 (two) times daily. One po bid x 7 days 04/10/19   Idali Lafever, MD  fluticasone (VERAMYST) 27.5 MCG/SPRAY nasal spray Place 2 sprays into each nostril daily. 04/10/19   Dakota Stangl, MD  lisinopril (PRINIVIL,ZESTRIL) 20 MG tablet Take 20 mg by mouth daily.    [provider]  potassium chloride (KLOR-CON) 20 MEQ packet Take by mouth 2 (two) times daily.    [provider]  pravastatin (PRAVACHOL) 20 MG tablet Take 20 mg by mouth daily.    [provider]  dicyclomine (BENTYL) 10 MG capsule Take 1 capsule (10 mg total) by mouth 4 (four) times daily -  before meals  and at bedtime. 12/23/17 04/10/19  Tilden Fossa, MD    Allergies Penicillins   REVIEW OF SYSTEMS  Negative except as noted here or in the History of Present Illness.   PHYSICAL EXAMINATION  Initial Vital Signs Blood pressure (!) 151/97, pulse 73, temperature 97.9 F (36.6 C), temperature source Oral, resp. rate 18, height 5\' 7"  (1.702 m), weight 104.3 kg, SpO2 98 %.  Examination General: Well-developed, well-nourished male in no acute distress; appearance consistent with age of record HENT: normocephalic; nasal turbinates erythematous with crusting and clotted blood on the left; mild nasal congestion; mild pharyngeal erythema Eyes: pupils equal, round and reactive to light; extraocular muscles intact Neck: supple; no lymphadenopathy Heart: regular rate and rhythm Lungs: clear to auscultation bilaterally Abdomen: soft; nondistended; nontender; bowel sounds present Extremities: No deformity; full range of motion Neurologic: Awake, alert and oriented; motor function intact in all extremities and symmetric; no facial droop Skin: Warm and dry Psychiatric: Normal mood and affect   RESULTS  Summary of this visit's results, reviewed and interpreted by myself:   EKG Interpretation  Date/Time:    Ventricular Rate:    PR Interval:    QRS Duration:   QT Interval:    QTC Calculation:   R Axis:     Text Interpretation:        Laboratory Studies: No results found for this or any previous visit (from  the past 24 hour(s)). Imaging Studies: No results found.  ED COURSE and MDM  Nursing notes, initial and subsequent vitals signs, including pulse oximetry, reviewed and interpreted by myself.  Vitals:   04/10/19 0635 04/10/19 0637  BP:  (!) 151/97  Pulse:  73  Resp:  18  Temp:  97.9 F (36.6 C)  TempSrc:  Oral  SpO2:  98%  Weight: 104.3 kg   Height: 5\' 7"  (1.702 m)    Medications  doxycycline (VIBRA-TABS) tablet 100 mg (has no administration in time range)     Symptoms and examination consistent with acute sinusitis.  PROCEDURES  Procedures   ED DIAGNOSES     ICD-10-CM   1. Acute recurrent maxillary sinusitis  J01.01        Caral Whan, Jenny Reichmann, MD 04/10/19 [phone]

## 2019-04-10 NOTE — ED Triage Notes (Signed)
Pt c/o "sinus infection" states he has sinus drainage.

## 2019-07-02 ENCOUNTER — Emergency Department (HOSPITAL_BASED_OUTPATIENT_CLINIC_OR_DEPARTMENT_OTHER): Payer: 59

## 2019-07-02 ENCOUNTER — Encounter (HOSPITAL_BASED_OUTPATIENT_CLINIC_OR_DEPARTMENT_OTHER): Payer: Self-pay | Admitting: *Deleted

## 2019-07-02 ENCOUNTER — Emergency Department (HOSPITAL_BASED_OUTPATIENT_CLINIC_OR_DEPARTMENT_OTHER)
Admission: EM | Admit: 2019-07-02 | Discharge: 2019-07-02 | Disposition: A | Discharge status: Home / Self Care | Payer: 59 | Attending: Emergency Medicine | Admitting: Emergency Medicine

## 2019-07-02 ENCOUNTER — Other Ambulatory Visit: Payer: Self-pay

## 2019-07-02 DIAGNOSIS — R509 Fever, unspecified: Secondary | ICD-10-CM

## 2019-07-02 DIAGNOSIS — U071 COVID-19: Secondary | ICD-10-CM | POA: Insufficient documentation

## 2019-07-02 DIAGNOSIS — I2510 Atherosclerotic heart disease of native coronary artery without angina pectoris: Secondary | ICD-10-CM | POA: Insufficient documentation

## 2019-07-02 DIAGNOSIS — I10 Essential (primary) hypertension: Secondary | ICD-10-CM | POA: Insufficient documentation

## 2019-07-02 DIAGNOSIS — Z79899 Other long term (current) drug therapy: Secondary | ICD-10-CM | POA: Diagnosis not present

## 2019-07-02 DIAGNOSIS — R05 Cough: Secondary | ICD-10-CM | POA: Diagnosis present

## 2019-07-02 LAB — RESPIRATORY PANEL BY RT PCR (FLU A&B, COVID)
Influenza A by PCR: NEGATIVE
Influenza B by PCR: NEGATIVE
SARS Coronavirus 2 by RT PCR: POSITIVE — AB

## 2019-07-02 MED ORDER — DIPHENHYDRAMINE HCL 25 MG PO CAPS
25.0000 mg | ORAL_CAPSULE | Freq: Once | ORAL | Status: AC
  Administered 2019-07-02: 25 mg via ORAL
  Filled 2019-07-02: qty 1

## 2019-07-02 MED ORDER — DOXYCYCLINE HYCLATE 100 MG PO TABS
100.0000 mg | ORAL_TABLET | Freq: Once | ORAL | Status: AC
  Administered 2019-07-02: 100 mg via ORAL
  Filled 2019-07-02: qty 1

## 2019-07-02 NOTE — ED Provider Notes (Signed)
[facility] EMERGENCY DEPARTMENT Provider Note   CSN: [id number] Arrival date & time: 07/02/19  0042     History Chief Complaint  Patient presents with  . Cough    Richard Le is a 49 y.o. male.   Cough Cough characteristics:  Non-productive Severity:  Mild Duration:  3 days Timing:  Constant Progression:  Worsening Chronicity:  New Relieved by:  Decongestant Worsened by:  Nothing Ineffective treatments:  None tried Associated symptoms: chest pain, chills, rhinorrhea and sinus congestion   Associated symptoms: no diaphoresis, no rash and no wheezing        Past Medical History:  Diagnosis Date  . Coronary artery disease   . Hypertension   . IBS (irritable bowel syndrome)     There are no problems to display for this patient.   Past Surgical History:  Procedure Laterality Date  . KNEE ARTHROSCOPY         No family history on file.  Social History   Tobacco Use  . Smoking status: Never Smoker  . Smokeless tobacco: Current User    Types: Chew  Substance Use Topics  . Alcohol use: No  . Drug use: Not Currently    Home Medications Prior to Admission medications   Medication Sig Start Date End Date Taking? Authorizing Provider  amlodipine-atorvastatin (CADUET) 10-10 MG tablet Take 1 tablet by mouth daily.    [provider]  diclofenac Sodium (VOLTAREN) 1 % GEL  03/25/19   [provider]  doxycycline (VIBRAMYCIN) 100 MG capsule Take 1 capsule (100 mg total) by mouth 2 (two) times daily. One po bid x 7 days 04/10/19   Molpus, John, MD  ergocalciferol (VITAMIN D2) 1.25 MG (50000 UT) capsule Take 1 capsule by mouth once a week. 06/17/19   [provider]  fluticasone Aleda Grana) 50 MCG/ACT nasal spray  06/15/19   [provider]  fluticasone (VERAMYST) 27.5 MCG/SPRAY nasal spray Place 2 sprays into each nostril daily. 04/10/19   Molpus, John, MD  lisinopril (PRINIVIL,ZESTRIL) 20 MG tablet Take 20 mg by mouth  daily.    [provider]  potassium chloride (KLOR-CON) 20 MEQ packet Take by mouth 2 (two) times daily.    [provider]  pravastatin (PRAVACHOL) 20 MG tablet Take 20 mg by mouth daily.    [provider]  dicyclomine (BENTYL) 10 MG capsule Take 1 capsule (10 mg total) by mouth 4 (four) times daily -  before meals and at bedtime. 12/23/17 04/10/19  Tilden Fossa, MD    Allergies    Penicillins  Review of Systems   Review of Systems  Constitutional: Positive for chills. Negative for diaphoresis.  HENT: Positive for rhinorrhea.   Respiratory: Positive for cough. Negative for wheezing.   Cardiovascular: Positive for chest pain.  Skin: Negative for rash.  All other systems reviewed and are negative.   Physical Exam Updated Vital Signs BP (!) 166/88 (BP Location: Left Arm)   Pulse 78   Temp 98.5 F (36.9 C) (Oral)   Resp 18   Ht 5\' 7"  (1.702 m)   Wt 104.3 kg   SpO2 97%   BMI 36.02 kg/m   Physical Exam Vitals and nursing note reviewed.  Constitutional:      Appearance: He is well-developed.  HENT:     Head: Normocephalic and atraumatic.     Mouth/Throat:     Mouth: Mucous membranes are dry.     Pharynx: Oropharynx is clear.  Eyes:  Conjunctiva/sclera: Conjunctivae normal.     Pupils: Pupils are equal, round, and reactive to light.  Cardiovascular:     Rate and Rhythm: Tachycardia present.  Pulmonary:     Effort: Pulmonary effort is normal. No respiratory distress.  Abdominal:     General: There is no distension.     Tenderness: There is no abdominal tenderness.  Musculoskeletal:        General: No swelling. Normal range of motion.     Cervical back: Normal range of motion.  Skin:    General: Skin is warm and dry.  Neurological:     General: No focal deficit present.     Mental Status: He is alert.     ED Results / Procedures / Treatments   Labs (all labs ordered are listed, but only abnormal results are displayed) Labs  Reviewed  RESPIRATORY PANEL BY RT PCR (FLU A&B, COVID) - Abnormal; Notable for the following components:      Result Value   SARS Coronavirus 2 by RT PCR POSITIVE (*)    All other components within normal limits    EKG None  Radiology DG Chest Port 1 View  Result Date: 07/02/2019 CLINICAL DATA:  Productive cough, fever EXAM: PORTABLE CHEST 1 VIEW COMPARISON:  12/09/2015 FINDINGS: Heart and mediastinal contours are within normal limits. No focal opacities or effusions. No acute bony abnormality. IMPRESSION: No active disease. Electronically Signed   By: Rolm Baptise M.D.   On: 07/02/2019 01:51    Procedures Procedures (including critical care time)  Medications Ordered in ED Medications  diphenhydrAMINE (BENADRYL) capsule 25 mg (25 mg Oral Given 07/02/19 0128)  doxycycline (VIBRA-TABS) tablet 100 mg (100 mg Oral Given 07/02/19 0128)    ED Course  I have reviewed the triage vital signs and the nursing notes.  Pertinent labs & imaging results that were available during my care of the patient were reviewed by me and considered in my medical decision making (see chart for details).    MDM Rules/Calculators/A&P                      Here with covid. No hypoxia, resp distress or other indication for admission or further workup at this time.  Final Clinical Impression(s) / ED Diagnoses Final diagnoses:  [id number]    Rx / DC Orders ED Discharge Orders    None       Katrese Shell, Corene Cornea, MD 07/02/19 [phone]

## 2019-07-02 NOTE — ED Triage Notes (Signed)
Pt c/o bil lower back pain productive cough and fever x 3 days

## 2019-07-03 ENCOUNTER — Other Ambulatory Visit: Payer: Self-pay | Admitting: Unknown Physician Specialty

## 2019-07-03 ENCOUNTER — Telehealth: Payer: Self-pay | Admitting: Unknown Physician Specialty

## 2019-07-03 DIAGNOSIS — U071 COVID-19: Secondary | ICD-10-CM

## 2019-07-03 MED ORDER — SODIUM CHLORIDE 0.9 % IV SOLN
Freq: Once | INTRAVENOUS | Status: AC
  Filled 2019-07-03: qty 700

## 2019-07-03 NOTE — Telephone Encounter (Signed)
  I connected by phone with Donzetta Matters on 07/03/2019 at 8:12 AM to discuss the potential use of an new treatment for mild to moderate COVID-19 viral infection in non-hospitalized patients.  This patient is a 49 y.o. male that meets the FDA criteria for Emergency Use Authorization of bamlanivimab/etesevimab or casirivimab/imdevimab.  Has a (+) direct SARS-CoV-2 viral test result  Has mild or moderate COVID-19   Is ? 49 years of age and weighs ? 40 kg  Is NOT hospitalized due to COVID-19  Is NOT requiring oxygen therapy or requiring an increase in baseline oxygen flow rate due to COVID-19  Is within 10 days of symptom onset  Has at least one of the high risk factor(s) for progression to severe COVID-19 and/or hospitalization as defined in EUA.  Specific high risk criteria : BMI >/= 35   I have spoken and communicated the following to the patient or parent/caregiver:  1. FDA has authorized the emergency use of bamlanivimab/etesevimab and casirivimab\imdevimab for the treatment of mild to moderate COVID-19 in adults and pediatric patients with positive results of direct SARS-CoV-2 viral testing who are 81 years of age and older weighing at least 40 kg, and who are at high risk for progressing to severe COVID-19 and/or hospitalization.  2. The significant known and potential risks and benefits of bamlanivimab/etesevimab and casirivimab\imdevimab, and the extent to which such potential risks and benefits are unknown.  3. Information on available alternative treatments and the risks and benefits of those alternatives, including clinical trials.  4. Patients treated with bamlanivimab/etesevimab and casirivimab\imdevimab should continue to self-isolate and use infection control measures (e.g., wear mask, isolate, social distance, avoid sharing personal items, clean and disinfect "high touch" surfaces, and frequent handwashing) according to CDC guidelines.   5. The patient or parent/caregiver  has the option to accept or refuse bamlanivimab/etesevimab or casirivimab\imdevimab .  After reviewing this information with the patient, The patient agreed to proceed with receiving the bamlanimivab infusion and will be provided a copy of the Fact sheet prior to receiving the infusion.Gabriel Cirri 07/03/2019 8:12 AM Sx onset 4/19

## 2019-07-04 ENCOUNTER — Ambulatory Visit (HOSPITAL_COMMUNITY)
Admission: RE | Admit: 2019-07-04 | Discharge: 2019-07-04 | Disposition: A | Discharge status: Home / Self Care | Payer: 59 | Source: Ambulatory Visit | Attending: Pulmonary Disease | Admitting: Pulmonary Disease

## 2019-07-04 DIAGNOSIS — U071 COVID-19: Secondary | ICD-10-CM | POA: Diagnosis present

## 2019-07-04 MED ORDER — ALBUTEROL SULFATE HFA 108 (90 BASE) MCG/ACT IN AERS: 2.0000 | INHALATION_SPRAY | Freq: Once | RESPIRATORY_TRACT | Status: DC | PRN

## 2019-07-04 MED ORDER — SODIUM CHLORIDE 0.9 % IV SOLN: INTRAVENOUS | Status: DC | PRN

## 2019-07-04 MED ORDER — METHYLPREDNISOLONE SODIUM SUCC 125 MG IJ SOLR: 125.0000 mg | Freq: Once | INTRAMUSCULAR | Status: DC | PRN

## 2019-07-04 MED ORDER — EPINEPHRINE 0.3 MG/0.3ML IJ SOAJ: 0.3000 mg | Freq: Once | INTRAMUSCULAR | Status: DC | PRN

## 2019-07-04 MED ORDER — DIPHENHYDRAMINE HCL 50 MG/ML IJ SOLN: 50.0000 mg | Freq: Once | INTRAMUSCULAR | Status: DC | PRN

## 2019-07-04 MED ORDER — FAMOTIDINE IN NACL 20-0.9 MG/50ML-% IV SOLN: 20.0000 mg | Freq: Once | INTRAVENOUS | Status: DC | PRN

## 2019-07-04 NOTE — Progress Notes (Signed)
  Diagnosis: COVID-19  Physician: Dr. Wright  Procedure: [facility] Med: bamlanivimab\etesevimab infusion - Provided patient with bamlanimivab\etesevimab fact sheet for patients, parents and caregivers prior to infusion.  Complications: No immediate complications noted.  Discharge: Discharged home   Richard Le 07/04/2019   

## 2019-07-04 NOTE — Discharge Instructions (Signed)

## 2019-09-09 ENCOUNTER — Encounter (HOSPITAL_BASED_OUTPATIENT_CLINIC_OR_DEPARTMENT_OTHER): Payer: Self-pay | Admitting: Emergency Medicine

## 2019-09-09 ENCOUNTER — Emergency Department (HOSPITAL_BASED_OUTPATIENT_CLINIC_OR_DEPARTMENT_OTHER): Payer: No Typology Code available for payment source

## 2019-09-09 ENCOUNTER — Other Ambulatory Visit: Payer: Self-pay

## 2019-09-09 ENCOUNTER — Emergency Department (HOSPITAL_BASED_OUTPATIENT_CLINIC_OR_DEPARTMENT_OTHER)
Admission: EM | Admit: 2019-09-09 | Discharge: 2019-09-09 | Disposition: A | Discharge status: Home / Self Care | Payer: No Typology Code available for payment source | Attending: Emergency Medicine | Admitting: Emergency Medicine

## 2019-09-09 DIAGNOSIS — Z79899 Other long term (current) drug therapy: Secondary | ICD-10-CM | POA: Insufficient documentation

## 2019-09-09 DIAGNOSIS — F17220 Nicotine dependence, chewing tobacco, uncomplicated: Secondary | ICD-10-CM | POA: Diagnosis not present

## 2019-09-09 DIAGNOSIS — R251 Tremor, unspecified: Secondary | ICD-10-CM | POA: Insufficient documentation

## 2019-09-09 DIAGNOSIS — I10 Essential (primary) hypertension: Secondary | ICD-10-CM | POA: Insufficient documentation

## 2019-09-09 LAB — CBC WITH DIFFERENTIAL/PLATELET
Abs Immature Granulocytes: 0.02 10*3/uL (ref 0.00–0.07)
Basophils Absolute: 0 10*3/uL (ref 0.0–0.1)
Basophils Relative: 0 %
Eosinophils Absolute: 0.1 10*3/uL (ref 0.0–0.5)
Eosinophils Relative: 2 %
HCT: 44.7 % (ref 39.0–52.0)
Hemoglobin: 14.3 g/dL (ref 13.0–17.0)
Immature Granulocytes: 0 %
Lymphocytes Relative: 60 %
Lymphs Abs: 4.3 10*3/uL — ABNORMAL HIGH (ref 0.7–4.0)
MCH: 27.7 pg (ref 26.0–34.0)
MCHC: 32 g/dL (ref 30.0–36.0)
MCV: 86.6 fL (ref 80.0–100.0)
Monocytes Absolute: 0.4 10*3/uL (ref 0.1–1.0)
Monocytes Relative: 6 %
Neutro Abs: 2.3 10*3/uL (ref 1.7–7.7)
Neutrophils Relative %: 32 %
Platelets: 174 10*3/uL (ref 150–400)
RBC: 5.16 MIL/uL (ref 4.22–5.81)
RDW: 15.9 % — ABNORMAL HIGH (ref 11.5–15.5)
WBC: 7.2 10*3/uL (ref 4.0–10.5)
nRBC: 0 % (ref 0.0–0.2)

## 2019-09-09 LAB — COMPREHENSIVE METABOLIC PANEL
ALT: 37 U/L (ref 0–44)
AST: 31 U/L (ref 15–41)
Albumin: 3.8 g/dL (ref 3.5–5.0)
Alkaline Phosphatase: 83 U/L (ref 38–126)
Anion gap: 11 (ref 5–15)
BUN: 17 mg/dL (ref 6–20)
CO2: 24 mmol/L (ref 22–32)
Calcium: 8.7 mg/dL — ABNORMAL LOW (ref 8.9–10.3)
Chloride: 105 mmol/L (ref 98–111)
Creatinine, Ser: 1.07 mg/dL (ref 0.61–1.24)
GFR calc Af Amer: >60 mL/min (ref >60)
GFR calc non Af Amer: >60 mL/min (ref >60)
Glucose, Bld: 144 mg/dL — ABNORMAL HIGH (ref 70–99)
Potassium: 3.6 mmol/L (ref 3.5–5.1)
Sodium: 140 mmol/L (ref 135–145)
Total Bilirubin: 0.5 mg/dL (ref 0.3–1.2)
Total Protein: 7.1 g/dL (ref 6.5–8.1)

## 2019-09-09 LAB — TROPONIN I (HIGH SENSITIVITY): Troponin I (High Sensitivity): 2 ng/L (ref <18)

## 2019-09-09 NOTE — ED Triage Notes (Signed)
Pt c/o chest pain that started tonight while walking up stairs. Pt also c/o feeling jittery.

## 2019-09-09 NOTE — ED Provider Notes (Signed)
[facility] Provider Note: Richard Dell, MD, FACEP  CSN: [id number] MRN: [id number] ARRIVAL: 09/09/19 at 0203 ROOM: MH02/MH02   CHIEF COMPLAINT  Chest Pain   HISTORY OF PRESENT ILLNESS  09/09/19 4:13 AM Richard Le is a 49 y.o. male who developed generalized tremors about midnight this morning.  The tremors were most prominent in the left pectoralis major and he states he can actually see the muscle fasciculate.  There was a discomfort with it but not a frank pain.  He states he felt anxious and mildly short of breath but was not diaphoretic nor did he have nausea or vomiting.  Nothing made the symptoms better or worse and they resolved soon after arrival here.  He has had no more symptoms since.   Past Medical History:  Diagnosis Date  . Hypertension   . IBS (irritable bowel syndrome)     Past Surgical History:  Procedure Laterality Date  . KNEE ARTHROSCOPY      No family history on file.  Social History   Tobacco Use  . Smoking status: Never Smoker  . Smokeless tobacco: Current User    Types: Chew  Vaping Use  . Vaping Use: Never used  Substance Use Topics  . Alcohol use: No  . Drug use: Not Currently    Prior to Admission medications   Medication Sig Start Date End Date Taking? Authorizing Provider  amlodipine-atorvastatin (CADUET) 10-10 MG tablet Take 1 tablet by mouth daily.    [provider]  diclofenac Sodium (VOLTAREN) 1 % GEL  03/25/19   [provider]  ergocalciferol (VITAMIN D2) 1.25 MG (50000 UT) capsule Take 1 capsule by mouth once a week. 06/17/19   [provider]  fluticasone Aleda Grana) 50 MCG/ACT nasal spray  06/15/19   [provider]  fluticasone (VERAMYST) 27.5 MCG/SPRAY nasal spray Place 2 sprays into each nostril daily. 04/10/19   Jermya Dowding, MD  lisinopril (PRINIVIL,ZESTRIL) 20 MG tablet Take 20 mg by mouth daily.    [provider]  potassium chloride (KLOR-CON) 20 MEQ packet Take by  mouth 2 (two) times daily.    [provider]  pravastatin (PRAVACHOL) 20 MG tablet Take 20 mg by mouth daily.    [provider]  dicyclomine (BENTYL) 10 MG capsule Take 1 capsule (10 mg total) by mouth 4 (four) times daily -  before meals and at bedtime. 12/23/17 04/10/19  Tilden Fossa, MD    Allergies Penicillins   REVIEW OF SYSTEMS  Negative except as noted here or in the History of Present Illness.   PHYSICAL EXAMINATION  Initial Vital Signs Blood pressure (!) 174/104, pulse 81, temperature 98.1 F (36.7 C), temperature source Oral, resp. rate 20, height 5\' 7"  (1.702 m), weight 102.1 kg, SpO2 99 %.  Examination General: Well-developed, well-nourished male in no acute distress; appearance consistent with age of record HENT: normocephalic; atraumatic Eyes: pupils equal, round and reactive to light; extraocular muscles intact Neck: supple Heart: regular rate and rhythm Lungs: clear to auscultation bilaterally Abdomen: soft; nondistended; nontender; bowel sounds present Extremities: No deformity; full range of motion; pulses normal Neurologic: Awake, alert and oriented; motor function intact in all extremities and symmetric; no facial droop Skin: Warm and dry; keloids of chest Psychiatric: Normal mood and affect   RESULTS  Summary of this visit's results, reviewed and interpreted by myself:   EKG Interpretation  Date/Time:  Thursday September 09 2019 02:13:09 EDT Ventricular Rate:  86 PR Interval:    QRS  Duration: 92 QT Interval:  348 QTC Calculation: 417 R Axis:   1 Text Interpretation: Normal sinus rhythm Rate is slower Tremor Artifact Confirmed by Paula Libra ([id number]) on 09/09/2019 2:21:48 AM      Laboratory Studies: Results for orders placed or performed during the hospital encounter of 09/09/19 (from the past 24 hour(s))  Troponin I (High Sensitivity)     Status: None   Collection Time: 09/09/19  2:32 AM  Result Value Ref Range   Troponin I  (High Sensitivity) 2 <18 ng/L  CBC with Differential     Status: Abnormal   Collection Time: 09/09/19  2:32 AM  Result Value Ref Range   WBC 7.2 4.0 - 10.5 K/uL   RBC 5.16 4.22 - 5.81 MIL/uL   Hemoglobin 14.3 13.0 - 17.0 g/dL   HCT 93.2 39 - 52 %   MCV 86.6 80.0 - 100.0 fL   MCH 27.7 26.0 - 34.0 pg   MCHC 32.0 30.0 - 36.0 g/dL   RDW 35.5 (H) 73.2 - 20.2 %   Platelets 174 150 - 400 K/uL   nRBC 0.0 0.0 - 0.2 %   Neutrophils Relative % 32 %   Neutro Abs 2.3 1.7 - 7.7 K/uL   Lymphocytes Relative 60 %   Lymphs Abs 4.3 (H) 0.7 - 4.0 K/uL   Monocytes Relative 6 %   Monocytes Absolute 0.4 0 - 1 K/uL   Eosinophils Relative 2 %   Eosinophils Absolute 0.1 0 - 0 K/uL   Basophils Relative 0 %   Basophils Absolute 0.0 0 - 0 K/uL   Immature Granulocytes 0 %   Abs Immature Granulocytes 0.02 0.00 - 0.07 K/uL  Comprehensive metabolic panel     Status: Abnormal   Collection Time: 09/09/19  2:32 AM  Result Value Ref Range   Sodium 140 135 - 145 mmol/L   Potassium 3.6 3.5 - 5.1 mmol/L   Chloride 105 98 - 111 mmol/L   CO2 24 22 - 32 mmol/L   Glucose, Bld 144 (H) 70 - 99 mg/dL   BUN 17 6 - 20 mg/dL   Creatinine, Ser 5.42 0.61 - 1.24 mg/dL   Calcium 8.7 (L) 8.9 - 10.3 mg/dL   Total Protein 7.1 6.5 - 8.1 g/dL   Albumin 3.8 3.5 - 5.0 g/dL   AST 31 15 - 41 U/L   ALT 37 0 - 44 U/L   Alkaline Phosphatase 83 38 - 126 U/L   Total Bilirubin 0.5 0.3 - 1.2 mg/dL   GFR calc non Af Amer >60 >60 mL/min   GFR calc Af Amer >60 >60 mL/min   Anion gap 11 5 - 15   Imaging Studies: DG Chest 2 View  Result Date: 09/09/2019 CLINICAL DATA:  Chest pain EXAM: CHEST - 2 VIEW COMPARISON:  07/02/2019 FINDINGS: Frontal and lateral views of the chest demonstrate a stable cardiac silhouette. No airspace disease, effusion, or pneumothorax. No acute bony abnormalities. IMPRESSION: 1. Stable exam, no acute process. Electronically Signed   By: Sharlet Salina M.D.   On: 09/09/2019 02:50    ED COURSE and MDM  Nursing  notes, initial and subsequent vitals signs, including pulse oximetry, reviewed and interpreted by myself.  Vitals:   09/09/19 0215 09/09/19 0216  BP: (!) 174/104   Pulse: 81   Resp: 20   Temp: 98.1 F (36.7 C)   TempSrc: Oral   SpO2: 99%   Weight:  102.1 kg  Height:  5\' 7"  (1.702 m)  Medications - No data to display  The patient's symptoms are more consistent with muscle fasciculation or tremors from anxiety then cardiac etiology.  His EKG, chest x-ray and laboratory studies are reassuring.  He is asymptomatic.  He has a history of coronary artery disease in the family but denies any personal history.  PROCEDURES  Procedures   ED DIAGNOSES     ICD-10-CM   1. Muscle tremor  R25.1        Happy Begeman, Jonny Ruiz, MD 09/09/19 0425

## 2020-08-20 IMAGING — CR DG CHEST 2V
2 series · 2 of 2 positions shown · non-contrast
Comparison: 07/02/2019

CLINICAL DATA: Chest pain

EXAM:
CHEST - 2 VIEW

[w chest pa]
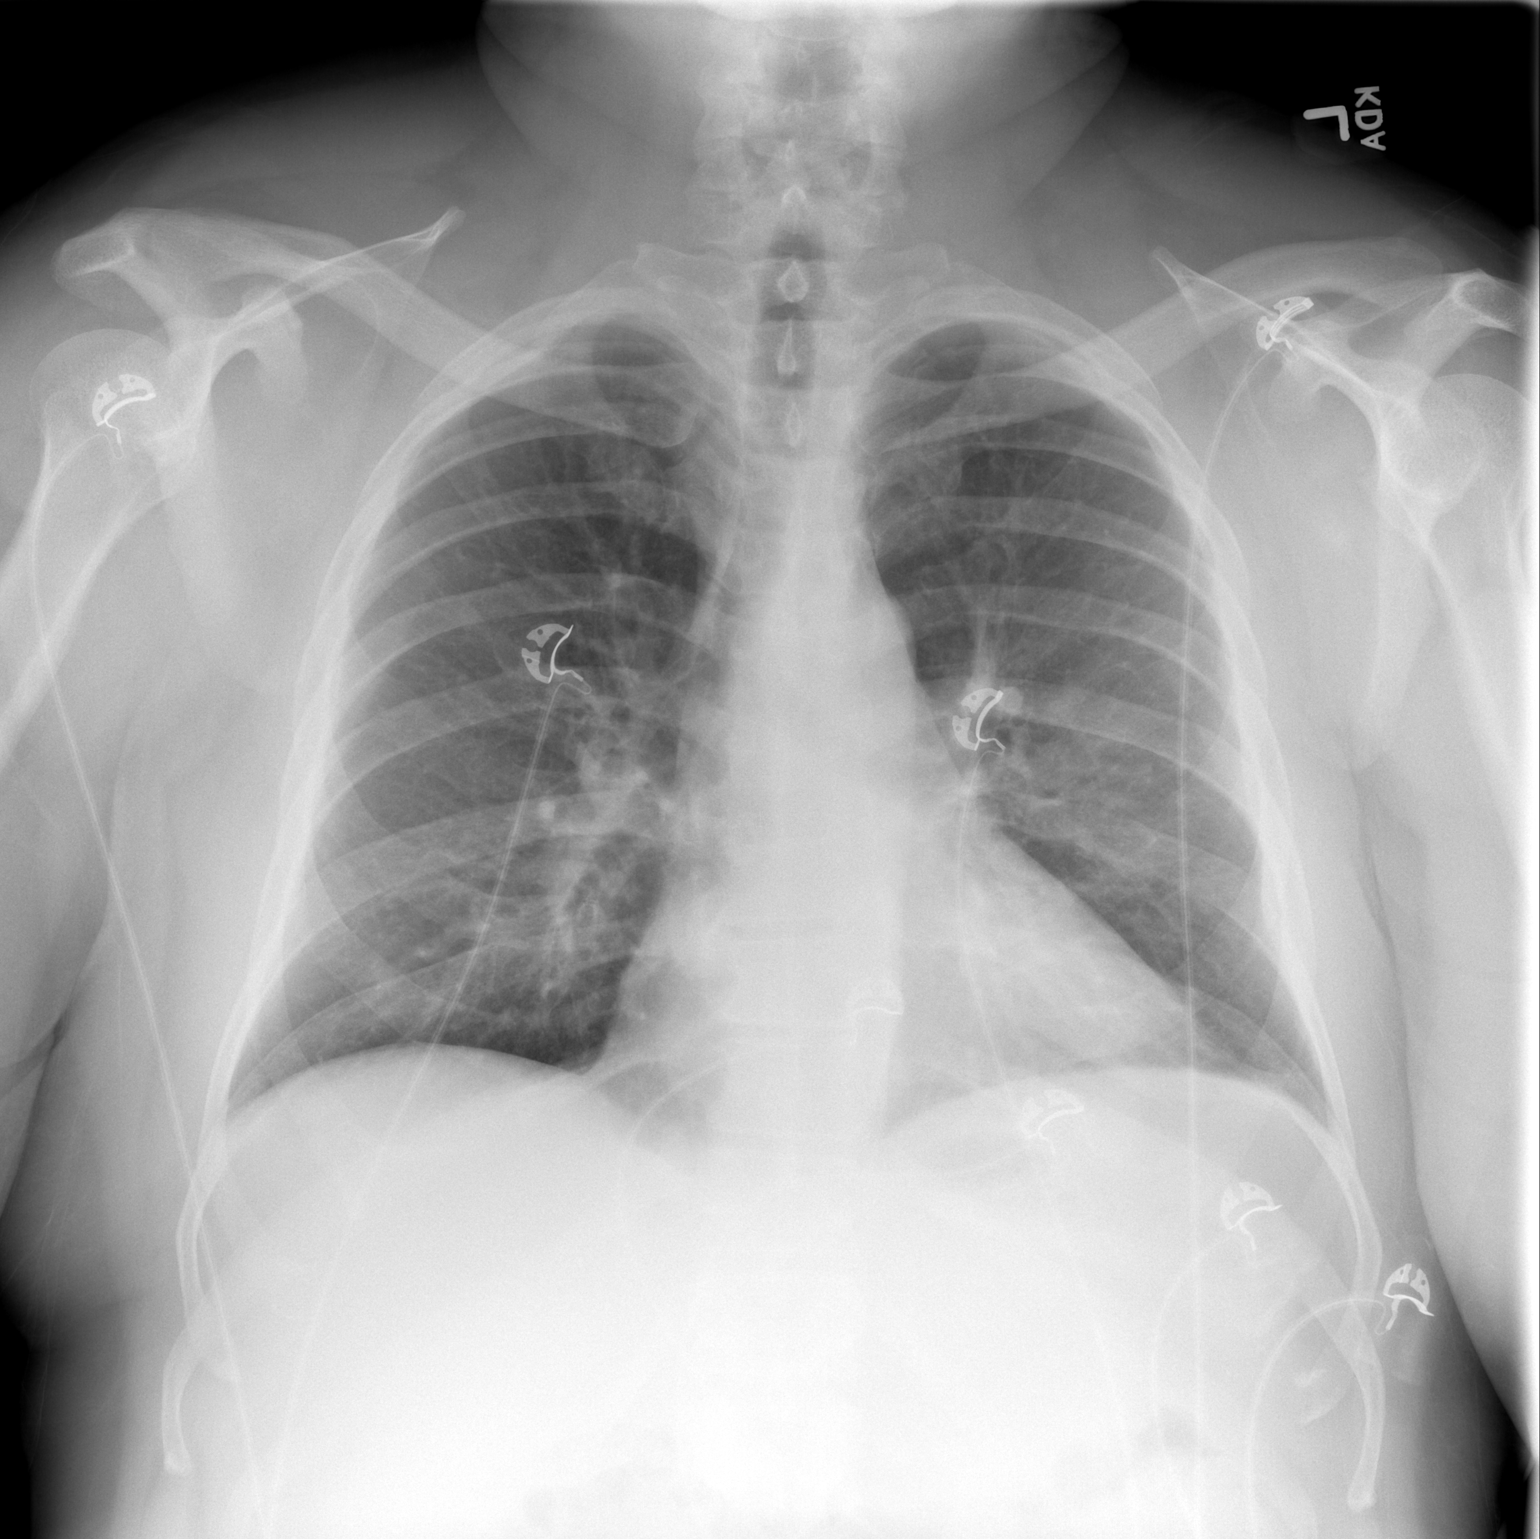

[w chest lat]
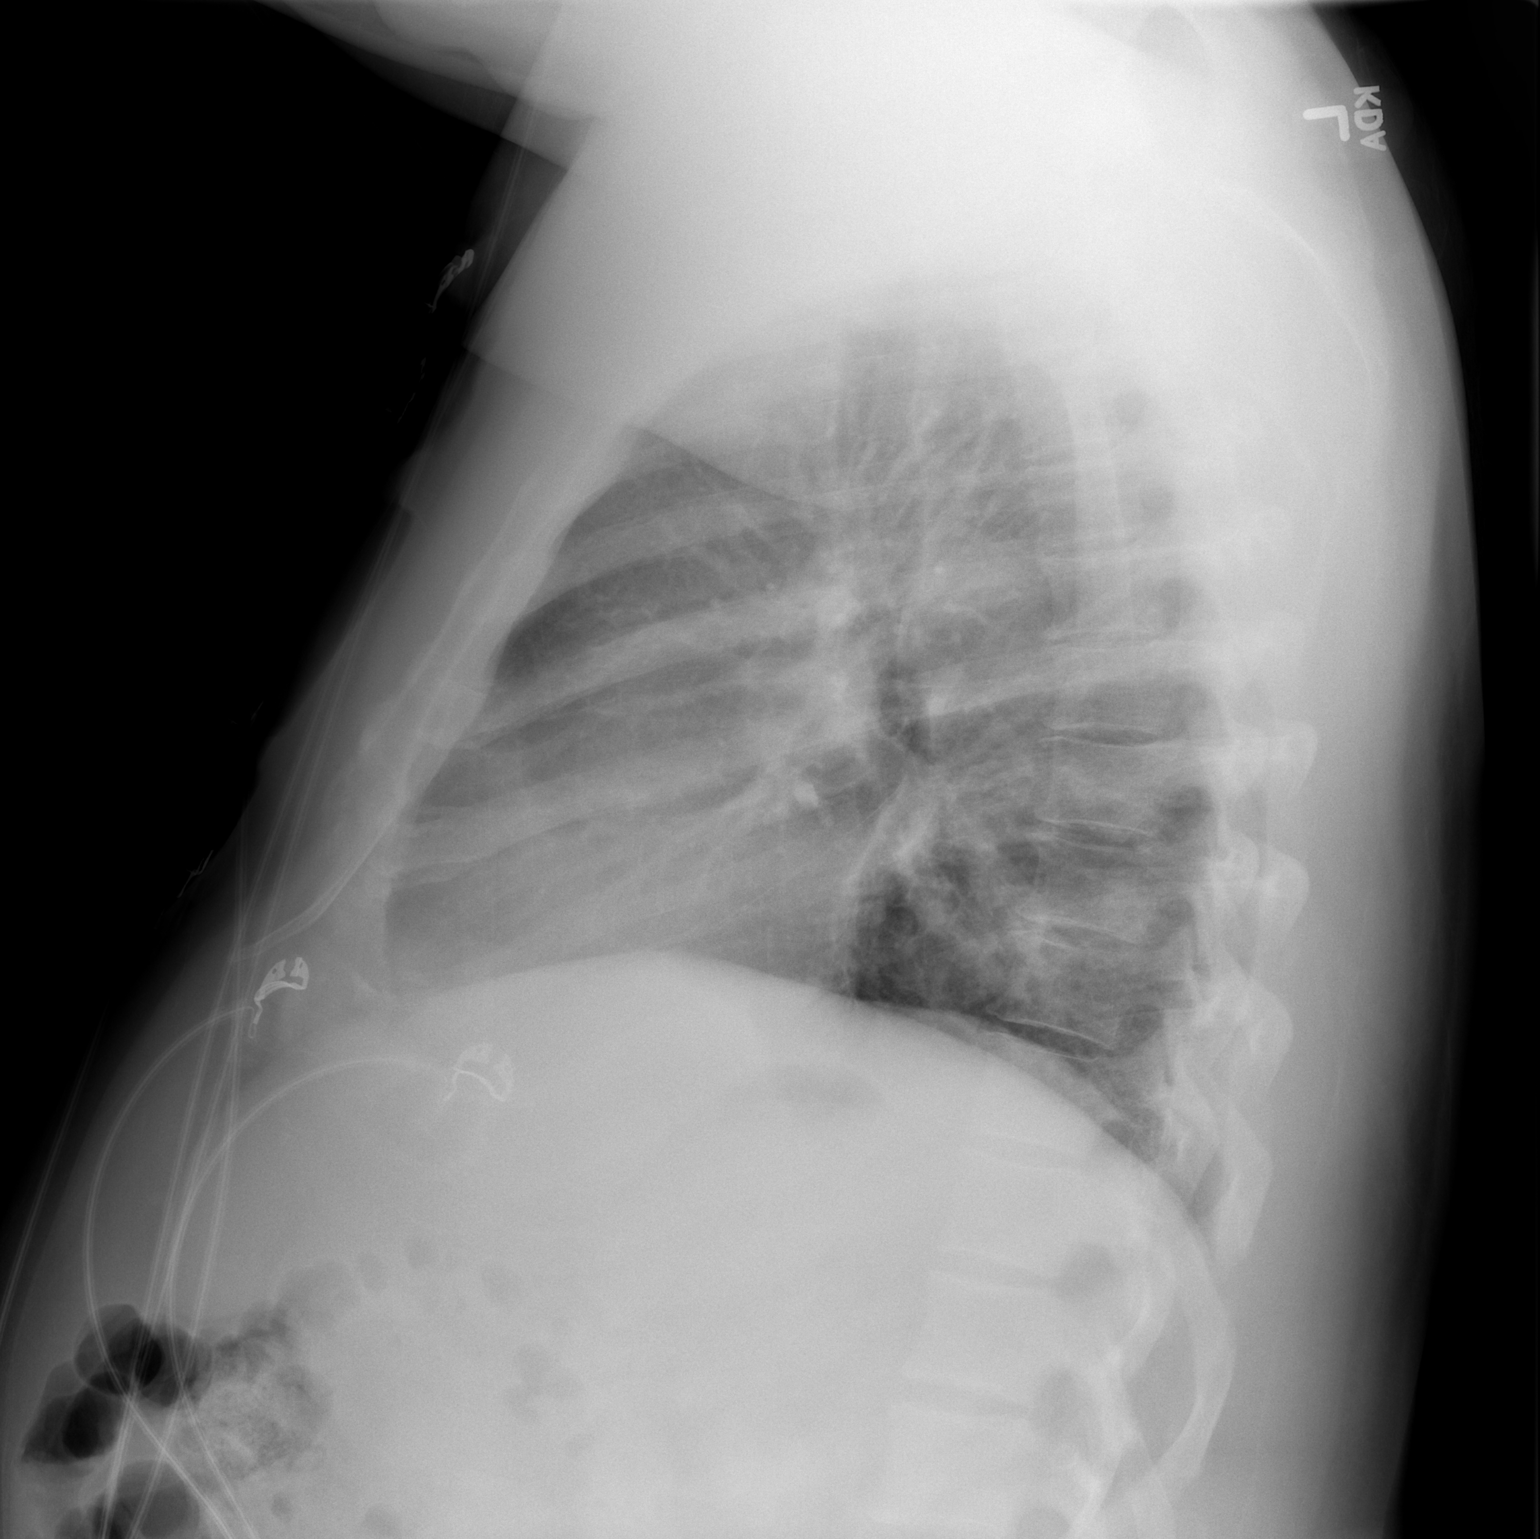

[2 of 2 positions shown; findings below may reference images not displayed]

FINDINGS: Frontal and lateral views of the chest demonstrate a stable cardiac
silhouette. No airspace disease, effusion, or pneumothorax. No acute
bony abnormalities.
IMPRESSION: 1. Stable exam, no acute process.

## 2021-03-10 ENCOUNTER — Other Ambulatory Visit: Payer: Self-pay

## 2021-03-10 ENCOUNTER — Emergency Department (HOSPITAL_BASED_OUTPATIENT_CLINIC_OR_DEPARTMENT_OTHER): Payer: No Typology Code available for payment source

## 2021-03-10 ENCOUNTER — Encounter (HOSPITAL_BASED_OUTPATIENT_CLINIC_OR_DEPARTMENT_OTHER): Payer: Self-pay | Admitting: Emergency Medicine

## 2021-03-10 ENCOUNTER — Emergency Department (HOSPITAL_BASED_OUTPATIENT_CLINIC_OR_DEPARTMENT_OTHER)
Admission: EM | Admit: 2021-03-10 | Discharge: 2021-03-10 | Disposition: A | Discharge status: Home / Self Care | Payer: No Typology Code available for payment source | Attending: Emergency Medicine | Admitting: Emergency Medicine

## 2021-03-10 DIAGNOSIS — J209 Acute bronchitis, unspecified: Secondary | ICD-10-CM | POA: Diagnosis not present

## 2021-03-10 DIAGNOSIS — M546 Pain in thoracic spine: Secondary | ICD-10-CM | POA: Diagnosis not present

## 2021-03-10 DIAGNOSIS — F17220 Nicotine dependence, chewing tobacco, uncomplicated: Secondary | ICD-10-CM | POA: Diagnosis not present

## 2021-03-10 DIAGNOSIS — R059 Cough, unspecified: Secondary | ICD-10-CM | POA: Diagnosis present

## 2021-03-10 DIAGNOSIS — Z20822 Contact with and (suspected) exposure to covid-19: Secondary | ICD-10-CM | POA: Diagnosis not present

## 2021-03-10 DIAGNOSIS — Z79899 Other long term (current) drug therapy: Secondary | ICD-10-CM | POA: Insufficient documentation

## 2021-03-10 DIAGNOSIS — I10 Essential (primary) hypertension: Secondary | ICD-10-CM | POA: Diagnosis not present

## 2021-03-10 DIAGNOSIS — Z8616 Personal history of COVID-19: Secondary | ICD-10-CM | POA: Insufficient documentation

## 2021-03-10 LAB — RESP PANEL BY RT-PCR (FLU A&B, COVID) ARPGX2
Influenza A by PCR: NEGATIVE
Influenza B by PCR: NEGATIVE
SARS Coronavirus 2 by RT PCR: NEGATIVE

## 2021-03-10 MED ORDER — ALBUTEROL SULFATE HFA 108 (90 BASE) MCG/ACT IN AERS
2.0000 | INHALATION_SPRAY | Freq: Once | RESPIRATORY_TRACT | Status: AC
  Administered 2021-03-10: 2 via RESPIRATORY_TRACT
  Filled 2021-03-10: qty 6.7

## 2021-03-10 MED ORDER — AZITHROMYCIN 250 MG PO TABS: 250.0000 mg | ORAL_TABLET | Freq: Every day | ORAL | 0 refills | Status: AC

## 2021-03-10 MED ORDER — PREDNISONE 20 MG PO TABS: 40.0000 mg | ORAL_TABLET | Freq: Every day | ORAL | 0 refills | Status: AC

## 2021-03-10 NOTE — ED Triage Notes (Signed)
Pt arrives pov with c/o cough, upper back pain and wheezing x 2 days, worsening today

## 2021-03-10 NOTE — Discharge Instructions (Addendum)
You were seen in the emergency department for cough and upper back pain and wheezing.  Your chest x-ray did not show any definite pneumonia.  Your COVID and flu test were negative.  We are treating you for bronchitis with steroids antibiotics and albuterol inhaler.  You can use the inhaler 2 puffs every 4 hours as needed.  Tylenol and ibuprofen as needed for pain.  Follow-up with your doctor.  Return to the emergency department if any worsening or concerning symptoms.

## 2021-03-10 NOTE — ED Provider Notes (Signed)
[facility] EMERGENCY DEPARTMENT Provider Note   CSN: [id number] Arrival date & time: 03/10/21  1829     History Chief Complaint  Patient presents with   Cough    Richard Le is a 50 y.o. male.  He is here with a complaint of cough for 2 days productive of some green sputum.  He also has pain in his upper back and is hearing some wheezing.  No known fevers.  No sore throat nausea vomiting diarrhea or urinary symptoms.  Has tried nothing for it.  Does not smoke.  Has had COVID before and he says this feels more like pneumonia.  The history is provided by the patient.  Cough Cough characteristics:  Productive Sputum characteristics:  Green Severity:  Moderate Onset quality:  Gradual Duration:  2 days Timing:  Intermittent Progression:  Unchanged Chronicity:  New Smoker: no   Relieved by:  None tried Worsened by:  Nothing Ineffective treatments:  None tried Associated symptoms: shortness of breath and wheezing   Associated symptoms: no chest pain, no diaphoresis, no fever, no headaches, no myalgias, no rash and no sore throat       Past Medical History:  Diagnosis Date   Hypertension    IBS (irritable bowel syndrome)     There are no problems to display for this patient.   Past Surgical History:  Procedure Laterality Date   KNEE ARTHROSCOPY         History reviewed. No pertinent family history.  Social History   Tobacco Use   Smoking status: Never   Smokeless tobacco: Current    Types: Chew  Vaping Use   Vaping Use: Never used  Substance Use Topics   Alcohol use: Yes    Comment: occ   Drug use: Never    Home Medications Prior to Admission medications   Medication Sig Start Date End Date Taking? Authorizing Provider  amlodipine-atorvastatin (CADUET) 10-10 MG tablet Take 1 tablet by mouth daily.    [provider]  diclofenac Sodium (VOLTAREN) 1 % GEL  03/25/19   [provider]  ergocalciferol (VITAMIN D2) 1.25 MG  (50000 UT) capsule Take 1 capsule by mouth once a week. 06/17/19   [provider]  fluticasone Aleda Grana) 50 MCG/ACT nasal spray  06/15/19   [provider]  fluticasone (VERAMYST) 27.5 MCG/SPRAY nasal spray Place 2 sprays into each nostril daily. 04/10/19   Molpus, John, MD  lisinopril (PRINIVIL,ZESTRIL) 20 MG tablet Take 20 mg by mouth daily.    [provider]  potassium chloride (KLOR-CON) 20 MEQ packet Take by mouth 2 (two) times daily.    [provider]  pravastatin (PRAVACHOL) 20 MG tablet Take 20 mg by mouth daily.    [provider]  dicyclomine (BENTYL) 10 MG capsule Take 1 capsule (10 mg total) by mouth 4 (four) times daily -  before meals and at bedtime. 12/23/17 04/10/19  Tilden Fossa, MD    Allergies    Penicillins  Review of Systems   Review of Systems  Constitutional:  Negative for diaphoresis and fever.  HENT:  Negative for sore throat.   Eyes:  Negative for visual disturbance.  Respiratory:  Positive for cough, shortness of breath and wheezing.   Cardiovascular:  Negative for chest pain.  Gastrointestinal:  Negative for abdominal pain.  Genitourinary:  Negative for dysuria.  Musculoskeletal:  Positive for back pain. Negative for myalgias.  Skin:  Negative for rash.  Neurological:  Negative for headaches.  Physical Exam Updated Vital Signs BP (!) 158/99    Pulse 84    Temp 98.2 F (36.8 C)    Resp 16    Ht 5\' 7"  (1.702 m)    Wt 102.1 kg    SpO2 99%    BMI 35.24 kg/m   Physical Exam Vitals and nursing note reviewed.  Constitutional:      General: He is not in acute distress.    Appearance: Normal appearance. He is well-developed.  HENT:     Head: Normocephalic and atraumatic.  Eyes:     Conjunctiva/sclera: Conjunctivae normal.  Cardiovascular:     Rate and Rhythm: Normal rate and regular rhythm.     Heart sounds: No murmur heard. Pulmonary:     Effort: Pulmonary effort is normal. No respiratory distress.      Breath sounds: Wheezing present.     Comments: Few scattered wheezes on left side Abdominal:     Palpations: Abdomen is soft.     Tenderness: There is no abdominal tenderness.  Musculoskeletal:        General: No swelling.     Cervical back: Neck supple.     Right lower leg: No edema.     Left lower leg: No edema.  Skin:    General: Skin is warm and dry.     Capillary Refill: Capillary refill takes less than 2 seconds.  Neurological:     General: No focal deficit present.     Mental Status: He is alert.  Psychiatric:        Mood and Affect: Mood normal.    ED Results / Procedures / Treatments   Labs (all labs ordered are listed, but only abnormal results are displayed) Labs Reviewed  RESP PANEL BY RT-PCR (FLU A&B, COVID) [id number]    EKG None  Radiology DG Chest 2 View  Result Date: 03/10/2021 CLINICAL DATA:  50 year old male with history of cough and back pain for the past 2 days. EXAM: CHEST - 2 VIEW COMPARISON:  Chest x-ray 09/09/2019. FINDINGS: Lung volumes are low. No consolidative airspace disease. No pleural effusions. No pneumothorax. No pulmonary nodule or mass noted. Pulmonary vasculature and the cardiomediastinal silhouette are within normal limits. IMPRESSION: 1. Low lung volumes without radiographic evidence of acute cardiopulmonary disease. Electronically Signed   By: Vinnie Langton M.D.   On: 03/10/2021 10:57    Procedures Procedures   Medications Ordered in ED Medications  albuterol (VENTOLIN HFA) 108 (90 Base) MCG/ACT inhaler 2 puff (has no administration in time range)    ED Course  I have reviewed the triage vital signs and the nursing notes.  Pertinent labs & imaging results that were available during my care of the patient were reviewed by me and considered in my medical decision making (see chart for details).  Clinical Course as of 03/10/21 1736  Sat Mar 10, 2021  1242 Patient feels better after albuterol inhaler.  Will discharge on antibiotics  and steroids for probable bronchitis. [MB]    Clinical Course User Index [MB] [name], MD   MDM Rules/Calculators/A&P                         TOSHIYUKI JENSEN was evaluated in Emergency Department on 03/10/2021 for the symptoms described in the history of present illness. He was evaluated in the context of the global COVID-19 pandemic, which necessitated consideration that the patient might be at risk for infection with the SARS-CoV-2 virus  that causes COVID-19. Institutional protocols and algorithms that pertain to the evaluation of patients at risk for COVID-19 are in a state of rapid change based on information released by regulatory bodies including the CDC and federal and state organizations. These policies and algorithms were followed during the patient's care in the ED.     Final Clinical Impression(s) / ED Diagnoses Final diagnoses:  Acute bronchitis, unspecified organism  Primary hypertension    Rx / DC Orders ED Discharge Orders          Ordered    predniSONE (DELTASONE) 20 MG tablet  Daily        03/10/21 1216    azithromycin (ZITHROMAX) 250 MG tablet  Daily        03/10/21 1216             [name], MD 03/10/21 1736

## 2021-05-12 ENCOUNTER — Encounter (HOSPITAL_BASED_OUTPATIENT_CLINIC_OR_DEPARTMENT_OTHER): Payer: Self-pay | Admitting: Emergency Medicine

## 2021-05-12 ENCOUNTER — Other Ambulatory Visit: Payer: Self-pay

## 2021-05-12 ENCOUNTER — Emergency Department (HOSPITAL_BASED_OUTPATIENT_CLINIC_OR_DEPARTMENT_OTHER): Payer: No Typology Code available for payment source

## 2021-05-12 ENCOUNTER — Emergency Department (HOSPITAL_BASED_OUTPATIENT_CLINIC_OR_DEPARTMENT_OTHER)
Admission: EM | Admit: 2021-05-12 | Discharge: 2021-05-12 | Disposition: A | Discharge status: Home / Self Care | Payer: No Typology Code available for payment source | Attending: Emergency Medicine | Admitting: Emergency Medicine

## 2021-05-12 DIAGNOSIS — R059 Cough, unspecified: Secondary | ICD-10-CM | POA: Diagnosis present

## 2021-05-12 DIAGNOSIS — J168 Pneumonia due to other specified infectious organisms: Secondary | ICD-10-CM | POA: Diagnosis not present

## 2021-05-12 DIAGNOSIS — J189 Pneumonia, unspecified organism: Secondary | ICD-10-CM

## 2021-05-12 MED ORDER — DOXYCYCLINE HYCLATE 100 MG PO TABS
100.0000 mg | ORAL_TABLET | Freq: Once | ORAL | Status: AC
Start: 2021-05-12 — End: 2021-05-12
  Administered 2021-05-12: 100 mg via ORAL
  Filled 2021-05-12: qty 1

## 2021-05-12 MED ORDER — DOXYCYCLINE HYCLATE 100 MG PO CAPS: 100.0000 mg | ORAL_CAPSULE | Freq: Two times a day (BID) | ORAL | 0 refills | Status: AC

## 2021-05-12 MED ORDER — BENZONATATE 100 MG PO CAPS: 100.0000 mg | ORAL_CAPSULE | Freq: Three times a day (TID) | ORAL | 0 refills | Status: AC

## 2021-05-12 NOTE — ED Provider Notes (Signed)
?[facility] EMERGENCY DEPARTMENT ?Provider Note ? ? ?CSN: [id number] ?Arrival date & time: 05/12/21  2207 ? ?  ? ?History ? ?Chief Complaint  ?Patient presents with  ? Cough  ? ? ?Richard Le is a 51 y.o. male. ? ?51 yo M with a chief complaints of cough and back pain.  He tells me has had pain like this before when he was diagnosed with pneumonia.  Thinks it feels the same.  Going on for a few days now.  No fevers or chills no significant difficulty breathing no nausea vomiting or diarrhea.  No known sick contacts. ? ? ?Cough ? ?  ? ?Home Medications ?Prior to Admission medications   ?Medication Sig Start Date End Date Taking? Authorizing Provider  ?benzonatate (TESSALON) 100 MG capsule Take 1 capsule (100 mg total) by mouth every 8 (eight) hours. 05/12/21  Yes Melene Plan, DO  ?doxycycline (VIBRAMYCIN) 100 MG capsule Take 1 capsule (100 mg total) by mouth 2 (two) times daily. 05/12/21  Yes Melene Plan, DO  ?amlodipine-atorvastatin (CADUET) 10-10 MG tablet Take 1 tablet by mouth daily.    [provider]  ?azithromycin (ZITHROMAX) 250 MG tablet Take 1 tablet (250 mg total) by mouth daily. Take first 2 tablets together, then 1 every day until finished. 03/10/21   Terrilee Files, MD  ?diclofenac Sodium (VOLTAREN) 1 % GEL  03/25/19   [provider]  ?ergocalciferol (VITAMIN D2) 1.25 MG (50000 UT) capsule Take 1 capsule by mouth once a week. 06/17/19   [provider]  ?fluticasone Aleda Grana) 50 MCG/ACT nasal spray  06/15/19   [provider]  ?fluticasone (VERAMYST) 27.5 MCG/SPRAY nasal spray Place 2 sprays into each nostril daily. 04/10/19   Molpus, John, MD  ?lisinopril (PRINIVIL,ZESTRIL) 20 MG tablet Take 20 mg by mouth daily.    [provider]  ?potassium chloride (KLOR-CON) 20 MEQ packet Take by mouth 2 (two) times daily.    [provider]  ?pravastatin (PRAVACHOL) 20 MG tablet Take 20 mg by mouth daily.    [provider]  ?predniSONE  (DELTASONE) 20 MG tablet Take 2 tablets (40 mg total) by mouth daily. 03/10/21   Terrilee Files, MD  ?dicyclomine (BENTYL) 10 MG capsule Take 1 capsule (10 mg total) by mouth 4 (four) times daily -  before meals and at bedtime. 12/23/17 04/10/19  Tilden Fossa, MD  ?   ? ?Allergies    ?Penicillins   ? ?Review of Systems   ?Review of Systems  ?Respiratory:  Positive for cough.   ? ?Physical Exam ?Updated Vital Signs ?BP (!) 161/89 (BP Location: Left Arm)   Pulse (!) 106   Resp 18   Ht 5\' 7"  (1.702 m)   Wt 102.1 kg   SpO2 92%   BMI 35.24 kg/m?  ?Physical Exam ?Vitals and nursing note reviewed.  ?Constitutional:   ?   Appearance: He is well-developed.  ?HENT:  ?   Head: Normocephalic and atraumatic.  ?Eyes:  ?   Pupils: Pupils are equal, round, and reactive to light.  ?Neck:  ?   Vascular: No JVD.  ?Cardiovascular:  ?   Rate and Rhythm: Normal rate and regular rhythm.  ?   Heart sounds: No murmur heard. ?  No friction rub. No gallop.  ?Pulmonary:  ?   Effort: No respiratory distress.  ?   Breath sounds: No wheezing.  ?Abdominal:  ?   General: There is no distension.  ?   Tenderness: There  is no abdominal tenderness. There is no guarding or rebound.  ?Musculoskeletal:     ?   General: Normal range of motion.  ?   Cervical back: Normal range of motion and neck supple.  ?Skin: ?   Coloration: Skin is not pale.  ?   Findings: No rash.  ?Neurological:  ?   Mental Status: He is alert and oriented to person, place, and time.  ?Psychiatric:     ?   Behavior: Behavior normal.  ? ? ?ED Results / Procedures / Treatments   ?Labs ?(all labs ordered are listed, but only abnormal results are displayed) ?Labs Reviewed - No data to display ? ?EKG ?None ? ?Radiology ?DG Chest Port 1 View ? ?Result Date: 05/12/2021 ?CLINICAL DATA:  Cough and chest pain. EXAM: PORTABLE CHEST 1 VIEW COMPARISON:  Chest x-ray 03/10/2021. FINDINGS: There are minimal patchy opacities in the left lung base with small left pleural effusion. Right lung is  clear. Cardiomediastinal silhouette is within normal limits. No acute fractures are seen. IMPRESSION: 1. Minimal left basilar airspace disease with small left pleural effusion. Electronically Signed   By: Darliss Cheney M.D.   On: 05/12/2021 22:45   ? ?Procedures ?Procedures  ? ? ?Medications Ordered in ED ?Medications  ?doxycycline (VIBRA-TABS) tablet 100 mg (has no administration in time range)  ? ? ?ED Course/ Medical Decision Making/ A&P ?  ?                        ?Medical Decision Making ?Amount and/or Complexity of Data Reviewed ?Radiology: ordered. ? ?Risk ?Prescription drug management. ? ? ?51  yo M with a chief complaints of cough and chest discomfort.  [name].  Worse with breathing and coughing.  [name].  Chest x-ray independently interpreted by me with a infiltrate in the left lower fields.  Will start on antibiotics for community-acquired pneumonia.  Have him follow-up with his family doctor. ? ?10:52 PM:  I have discussed the diagnosis/risks/treatment options with the patient.  Evaluation and diagnostic testing in the emergency department does not suggest an emergent condition requiring admission or immediate intervention beyond what has been performed at this time.  They will follow up with  PCP. We also discussed returning to the ED immediately if new or worsening sx occur. We discussed the sx which are most concerning (e.g., sudden worsening pain, fever, inability to tolerate by mouth) that necessitate immediate return. Medications administered to the patient during their visit and any new prescriptions provided to the patient are listed below. ? ?Medications given during this visit ?Medications  ?doxycycline (VIBRA-TABS) tablet 100 mg (has no administration in time range)  ? ? ? ?The patient appears reasonably screen and/or stabilized for discharge and I doubt any other medical condition or other [facility] requiring further  screening, evaluation, or treatment in the ED at this time prior to discharge.  ? ? ? ? ? ? ? ? ?Final Clinical Impression(s) / ED Diagnoses ?Final diagnoses:  ?Pneumonia of left lower lobe due to infectious organism  ? ? ?Rx / DC Orders ?ED Discharge Orders   ? ?      Ordered  ?  doxycycline (VIBRAMYCIN) 100 MG capsule  2 times daily       ? 05/12/21 2246  ?  benzonatate (TESSALON) 100 MG capsule  Every 8 hours       ? 05/12/21  2246  ? ?  ?  ? ?  ? ? ?  ?Melene Plan, DO ?05/12/21 2252 ? ?

## 2021-05-12 NOTE — ED Triage Notes (Signed)
Reports cough and pain in mid back "where my lungs are".  Thinks he may have pneumonia.  Had low grade fever at home.  Took ibuprofen. ?

## 2021-05-12 NOTE — Discharge Instructions (Signed)
Return for worsening difficulty breathing, fever.  ? ?Take tylenol 2 pills 4 times a day and motrin 4 pills 3 times a day.  Drink plenty of fluids.  Return for worsening shortness of breath, headache, confusion. Follow up with your family doctor.  ? ?

## 2021-08-22 ENCOUNTER — Emergency Department (HOSPITAL_BASED_OUTPATIENT_CLINIC_OR_DEPARTMENT_OTHER)
Admission: EM | Admit: 2021-08-22 | Discharge: 2021-08-23 | Disposition: A | Discharge status: Home / Self Care | Payer: No Typology Code available for payment source | Attending: Emergency Medicine | Admitting: Emergency Medicine

## 2021-08-22 ENCOUNTER — Other Ambulatory Visit: Payer: Self-pay

## 2021-08-22 ENCOUNTER — Encounter (HOSPITAL_BASED_OUTPATIENT_CLINIC_OR_DEPARTMENT_OTHER): Payer: Self-pay

## 2021-08-22 DIAGNOSIS — Z7984 Long term (current) use of oral hypoglycemic drugs: Secondary | ICD-10-CM | POA: Insufficient documentation

## 2021-08-22 DIAGNOSIS — R739 Hyperglycemia, unspecified: Secondary | ICD-10-CM | POA: Diagnosis present

## 2021-08-22 DIAGNOSIS — Z794 Long term (current) use of insulin: Secondary | ICD-10-CM | POA: Insufficient documentation

## 2021-08-22 HISTORY — DX: Type 2 diabetes mellitus without complications: E11.9

## 2021-08-22 LAB — CBC WITH DIFFERENTIAL/PLATELET
Abs Immature Granulocytes: 0.03 10*3/uL (ref 0.00–0.07)
Basophils Absolute: 0 10*3/uL (ref 0.0–0.1)
Basophils Relative: 0 %
Eosinophils Absolute: 0 10*3/uL (ref 0.0–0.5)
Eosinophils Relative: 0 %
HCT: 43.2 % (ref 39.0–52.0)
Hemoglobin: 15 g/dL (ref 13.0–17.0)
Immature Granulocytes: 0 %
Lymphocytes Relative: 21 %
Lymphs Abs: 2.2 10*3/uL (ref 0.7–4.0)
MCH: 28.4 pg (ref 26.0–34.0)
MCHC: 34.7 g/dL (ref 30.0–36.0)
MCV: 81.8 fL (ref 80.0–100.0)
Monocytes Absolute: 0 10*3/uL — ABNORMAL LOW (ref 0.1–1.0)
Monocytes Relative: 0 %
Neutro Abs: 8.4 10*3/uL — ABNORMAL HIGH (ref 1.7–7.7)
Neutrophils Relative %: 79 %
Platelets: 183 10*3/uL (ref 150–400)
RBC: 5.28 MIL/uL (ref 4.22–5.81)
RDW: 14.1 % (ref 11.5–15.5)
WBC: 10.6 10*3/uL — ABNORMAL HIGH (ref 4.0–10.5)
nRBC: 0 % (ref 0.0–0.2)

## 2021-08-22 LAB — BASIC METABOLIC PANEL
Anion gap: 12 (ref 5–15)
BUN: 21 mg/dL — ABNORMAL HIGH (ref 6–20)
CO2: 19 mmol/L — ABNORMAL LOW (ref 22–32)
Calcium: 9.4 mg/dL (ref 8.9–10.3)
Chloride: 101 mmol/L (ref 98–111)
Creatinine, Ser: 1.37 mg/dL — ABNORMAL HIGH (ref 0.61–1.24)
GFR, Estimated: >60 mL/min (ref >60)
Glucose, Bld: 421 mg/dL — ABNORMAL HIGH (ref 70–99)
Potassium: 4.3 mmol/L (ref 3.5–5.1)
Sodium: 132 mmol/L — ABNORMAL LOW (ref 135–145)

## 2021-08-22 LAB — CBG MONITORING, ED: Glucose-Capillary: 352 mg/dL — ABNORMAL HIGH (ref 70–99)

## 2021-08-22 MED ORDER — SODIUM CHLORIDE 0.9 % IV BOLUS
1000.0000 mL | Freq: Once | INTRAVENOUS | Status: AC
  Administered 2021-08-22: 1000 mL via INTRAVENOUS

## 2021-08-22 NOTE — ED Triage Notes (Addendum)
PT states he got a steroid injection this morning in bilateral knees. States his blood sugar is reading HIGH. Pt denies any symptoms associated with the elevated BG. Pt currently only takes metformin.   Pt is, however, tachycardic in triage. Denies cp or sob.

## 2021-08-23 LAB — URINALYSIS, ROUTINE W REFLEX MICROSCOPIC
Bilirubin Urine: NEGATIVE
Glucose, UA: >=500 mg/dL — AB
Hgb urine dipstick: NEGATIVE
Ketones, ur: 15 mg/dL — AB
Leukocytes,Ua: NEGATIVE
Nitrite: NEGATIVE
Protein, ur: NEGATIVE mg/dL
Specific Gravity, Urine: 1.01 (ref 1.005–1.030)
pH: 5.5 (ref 5.0–8.0)

## 2021-08-23 LAB — CBG MONITORING, ED: Glucose-Capillary: 385 mg/dL — ABNORMAL HIGH (ref 70–99)

## 2021-08-23 LAB — URINALYSIS, MICROSCOPIC (REFLEX)

## 2021-08-23 MED ORDER — INSULIN ASPART 100 UNIT/ML IJ SOLN
10.0000 [IU] | Freq: Once | INTRAMUSCULAR | Status: AC
  Administered 2021-08-23: 10 [IU] via SUBCUTANEOUS

## 2021-08-23 MED ORDER — METFORMIN HCL 500 MG PO TABS: 500.0000 mg | ORAL_TABLET | Freq: Two times a day (BID) | ORAL | 0 refills | Status: AC

## 2021-08-23 NOTE — Discharge Instructions (Signed)
Follow up with your family doc. take your metformin twice a day.  Please return for worsening trouble with your blood sugar.

## 2021-08-23 NOTE — ED Notes (Signed)
Patient refused d/c vital signs. 

## 2021-08-23 NOTE — ED Provider Notes (Signed)
[facility] Provider Note   CSN: [id number] Arrival date & time: 08/22/21  2129     History  Chief Complaint  Patient presents with   Hyperglycemia    Richard Le is a 51 y.o. male.  51 yo M with a chief complaints of high blood sugar reading.  The patient had bilateral cortisone injections to his knees and was told that he should check his blood sugar because it could go up.  He has been on metformin as an outpatient and has titrated down from twice daily dosing to once daily.  He has never been on insulin.  He feels otherwise fine.  Has been eating a low carbohydrate diet.  Checked his blood sugar multiple times throughout the day and realized it was high and came to the emergency department for evaluation.   Hyperglycemia      Home Medications Prior to Admission medications   Medication Sig Start Date End Date Taking? Authorizing Provider  metFORMIN (GLUCOPHAGE) 500 MG tablet Take 1 tablet (500 mg total) by mouth 2 (two) times daily with a meal. 08/23/21  Yes Deno Etienne, DO  amlodipine-atorvastatin (CADUET) 10-10 MG tablet Take 1 tablet by mouth daily.    [provider]  azithromycin (ZITHROMAX) 250 MG tablet Take 1 tablet (250 mg total) by mouth daily. Take first 2 tablets together, then 1 every day until finished. 03/10/21   [name], MD  benzonatate (TESSALON) 100 MG capsule Take 1 capsule (100 mg total) by mouth every 8 (eight) hours. 05/12/21   Deno Etienne, DO  diclofenac Sodium (VOLTAREN) 1 % GEL  03/25/19   [provider]  doxycycline (VIBRAMYCIN) 100 MG capsule Take 1 capsule (100 mg total) by mouth 2 (two) times daily. 05/12/21   Deno Etienne, DO  ergocalciferol (VITAMIN D2) 1.25 MG (50000 UT) capsule Take 1 capsule by mouth once a week. 06/17/19   [provider]  fluticasone Asencion Islam) 50 MCG/ACT nasal spray  06/15/19   [provider]  fluticasone (VERAMYST) 27.5 MCG/SPRAY nasal spray Place 2  sprays into each nostril daily. 04/10/19   Molpus, John, MD  lisinopril (PRINIVIL,ZESTRIL) 20 MG tablet Take 20 mg by mouth daily.    [provider]  potassium chloride (KLOR-CON) 20 MEQ packet Take by mouth 2 (two) times daily.    [provider]  pravastatin (PRAVACHOL) 20 MG tablet Take 20 mg by mouth daily.    [provider]  predniSONE (DELTASONE) 20 MG tablet Take 2 tablets (40 mg total) by mouth daily. 03/10/21   [name], MD  dicyclomine (BENTYL) 10 MG capsule Take 1 capsule (10 mg total) by mouth 4 (four) times daily -  before meals and at bedtime. 12/23/17 04/10/19  Quintella Reichert, MD      Allergies    Penicillins    Review of Systems   Review of Systems  Physical Exam Updated Vital Signs BP (!) 157/107 (BP Location: Right Arm)   Pulse (!) 127   Temp 98.6 F (37 C) (Oral)   Resp 18   Ht 5\' 7"  (1.702 m)   Wt 102.1 kg   SpO2 95%   BMI 35.24 kg/m  Physical Exam Vitals and nursing note reviewed.  Constitutional:      Appearance: He is well-developed.  HENT:     Head: Normocephalic and atraumatic.  Eyes:     Pupils: Pupils are equal, round, and reactive to light.  Neck:     Vascular:  No JVD.  Cardiovascular:     Rate and Rhythm: Regular rhythm. Tachycardia present.     Heart sounds: No murmur heard.    No friction rub. No gallop.  Pulmonary:     Effort: No respiratory distress.     Breath sounds: No wheezing.  Abdominal:     General: There is no distension.     Tenderness: There is no abdominal tenderness. There is no guarding or rebound.  Musculoskeletal:        General: Normal range of motion.     Cervical back: Normal range of motion and neck supple.  Skin:    Coloration: Skin is not pale.     Findings: No rash.  Neurological:     Mental Status: He is alert and oriented to person, place, and time.  Psychiatric:        Behavior: Behavior normal.     ED Results / Procedures / Treatments   Labs (all labs ordered  are listed, but only abnormal results are displayed) Labs Reviewed  CBC WITH DIFFERENTIAL/PLATELET - Abnormal; Notable for the following components:      Result Value   WBC 10.6 (*)    Neutro Abs 8.4 (*)    Monocytes Absolute 0.0 (*)    All other components within normal limits  BASIC METABOLIC PANEL - Abnormal; Notable for the following components:   Sodium 132 (*)    CO2 19 (*)    Glucose, Bld 421 (*)    BUN 21 (*)    Creatinine, Ser 1.37 (*)    All other components within normal limits  URINALYSIS, ROUTINE W REFLEX MICROSCOPIC - Abnormal; Notable for the following components:   Glucose, UA >=500 (*)    Ketones, ur 15 (*)    All other components within normal limits  URINALYSIS, MICROSCOPIC (REFLEX) - Abnormal; Notable for the following components:   Bacteria, UA RARE (*)    All other components within normal limits  CBG MONITORING, ED - Abnormal; Notable for the following components:   Glucose-Capillary 352 (*)    All other components within normal limits  CBG MONITORING, ED - Abnormal; Notable for the following components:   Glucose-Capillary 385 (*)    All other components within normal limits    EKG EKG Interpretation  Date/Time:  Wednesday August 22 2021 21:49:45 EDT Ventricular Rate:  127 PR Interval:  172 QRS Duration: 80 QT Interval:  298 QTC Calculation: 433 R Axis:   10 Text Interpretation: Sinus tachycardia Possible Anterior infarct , age undetermined ST & T wave abnormality, consider inferior ischemia Abnormal ECG When compared with ECG of 09-Sep-2019 02:13, PREVIOUS ECG IS PRESENT Confirmed by Edwin Dada ([id number]) on 08/22/2021 11:15:28 PM  Radiology No results found.  Procedures Procedures    Medications Ordered in ED Medications  sodium chloride 0.9 % bolus 1,000 mL (0 mLs Intravenous Stopped 08/23/21 0128)  insulin aspart (novoLOG) injection 10 Units (10 Units Subcutaneous Given 08/23/21 0135)    ED Course/ Medical Decision Making/ A&P                            Medical Decision Making Risk Prescription drug management.   51 yo M with a chief complaints of hyperglycemia.  This has been going on since this morning.  Had steroid injections to his knees and has been checking his blood sugar throughout the day.  Had a reading of high on his glucometer.  Here the blood  sugars been in the 400s.  He does have a very slight acidosis without anion gap.  He has small ketones in his urine.  He is not on insulin at home and due to the prolonged course of the steroid injection I discussed observation in the hospital which he is declining.  He was given a liter of IV fluids with some improvement of his symptoms as well as his blood sugar.  I gave him a bolus dose of insulin here.  We will have him go back to twice daily dosing of metformin.  We will have him call his doctor in the morning.  We will have him return for any worsening symptoms or any significantly elevated blood sugars.  2:24 AM:  I have discussed the diagnosis/risks/treatment options with the patient.  Evaluation and diagnostic testing in the emergency department does not suggest an emergent condition requiring admission or immediate intervention beyond what has been performed at this time.  They will follow up with  PCP. We also discussed returning to the ED immediately if new or worsening sx occur. We discussed the sx which are most concerning (e.g., sudden worsening pain, fever, inability to tolerate by mouth) that necessitate immediate return. Medications administered to the patient during their visit and any new prescriptions provided to the patient are listed below.  Medications given during this visit Medications  sodium chloride 0.9 % bolus 1,000 mL (0 mLs Intravenous Stopped 08/23/21 0128)  insulin aspart (novoLOG) injection 10 Units (10 Units Subcutaneous Given 08/23/21 0135)     The patient appears reasonably screen and/or stabilized for discharge and I doubt any other medical  condition or other [facility] requiring further screening, evaluation, or treatment in the ED at this time prior to discharge.          Final Clinical Impression(s) / ED Diagnoses Final diagnoses:  Hyperglycemia    Rx / DC Orders ED Discharge Orders          Ordered    metFORMIN (GLUCOPHAGE) 500 MG tablet  2 times daily with meals        08/23/21 0123              Melene Plan, DO 08/23/21 2353

## 2022-02-19 IMAGING — CR DG CHEST 2V
2 series · 2 of 2 positions shown · non-contrast
Comparison: Chest x-ray 09/09/2019.

CLINICAL DATA: 50-year-old male with history of cough and back pain
for the past 2 days.

EXAM:
CHEST - 2 VIEW

[w chest pa]
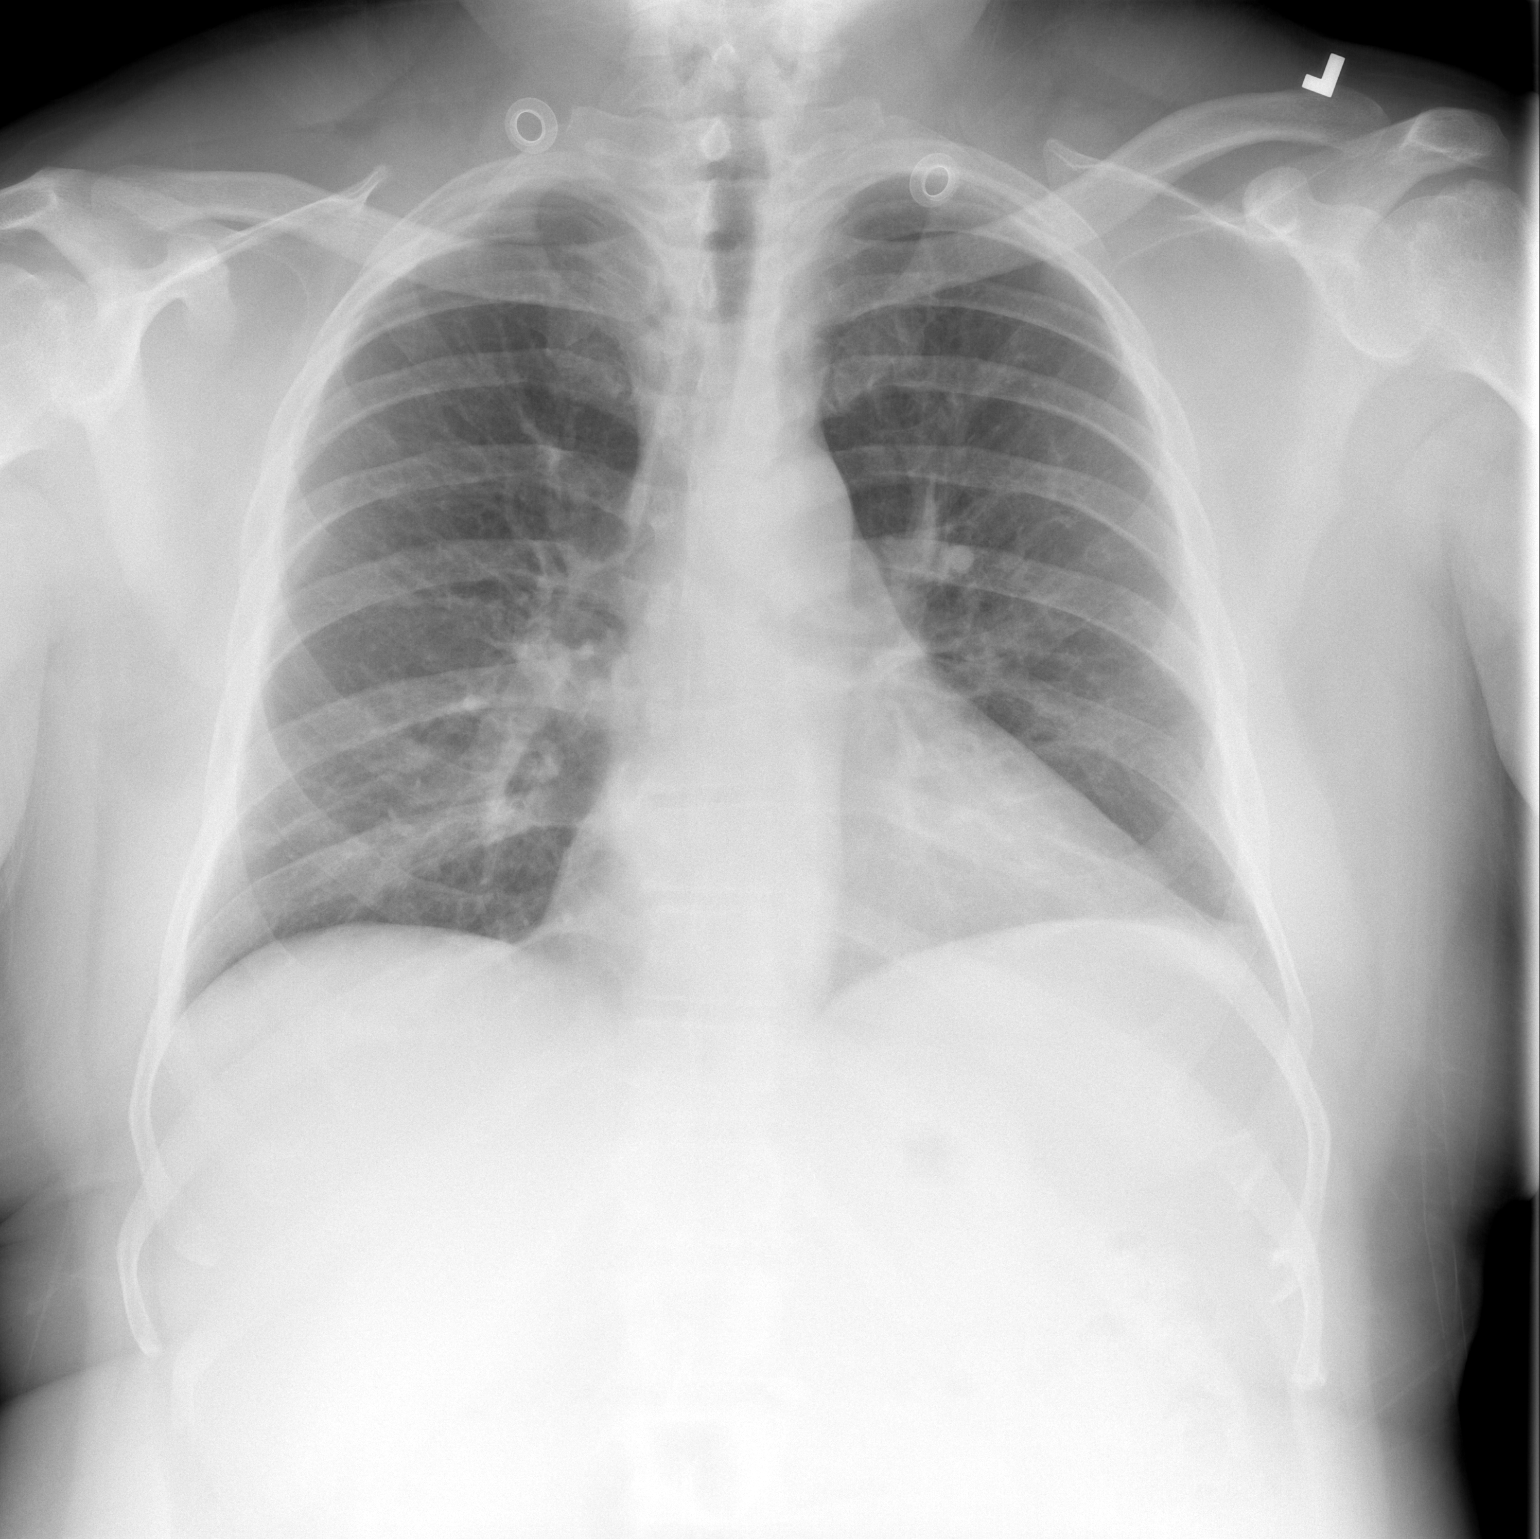

[w chest lat]
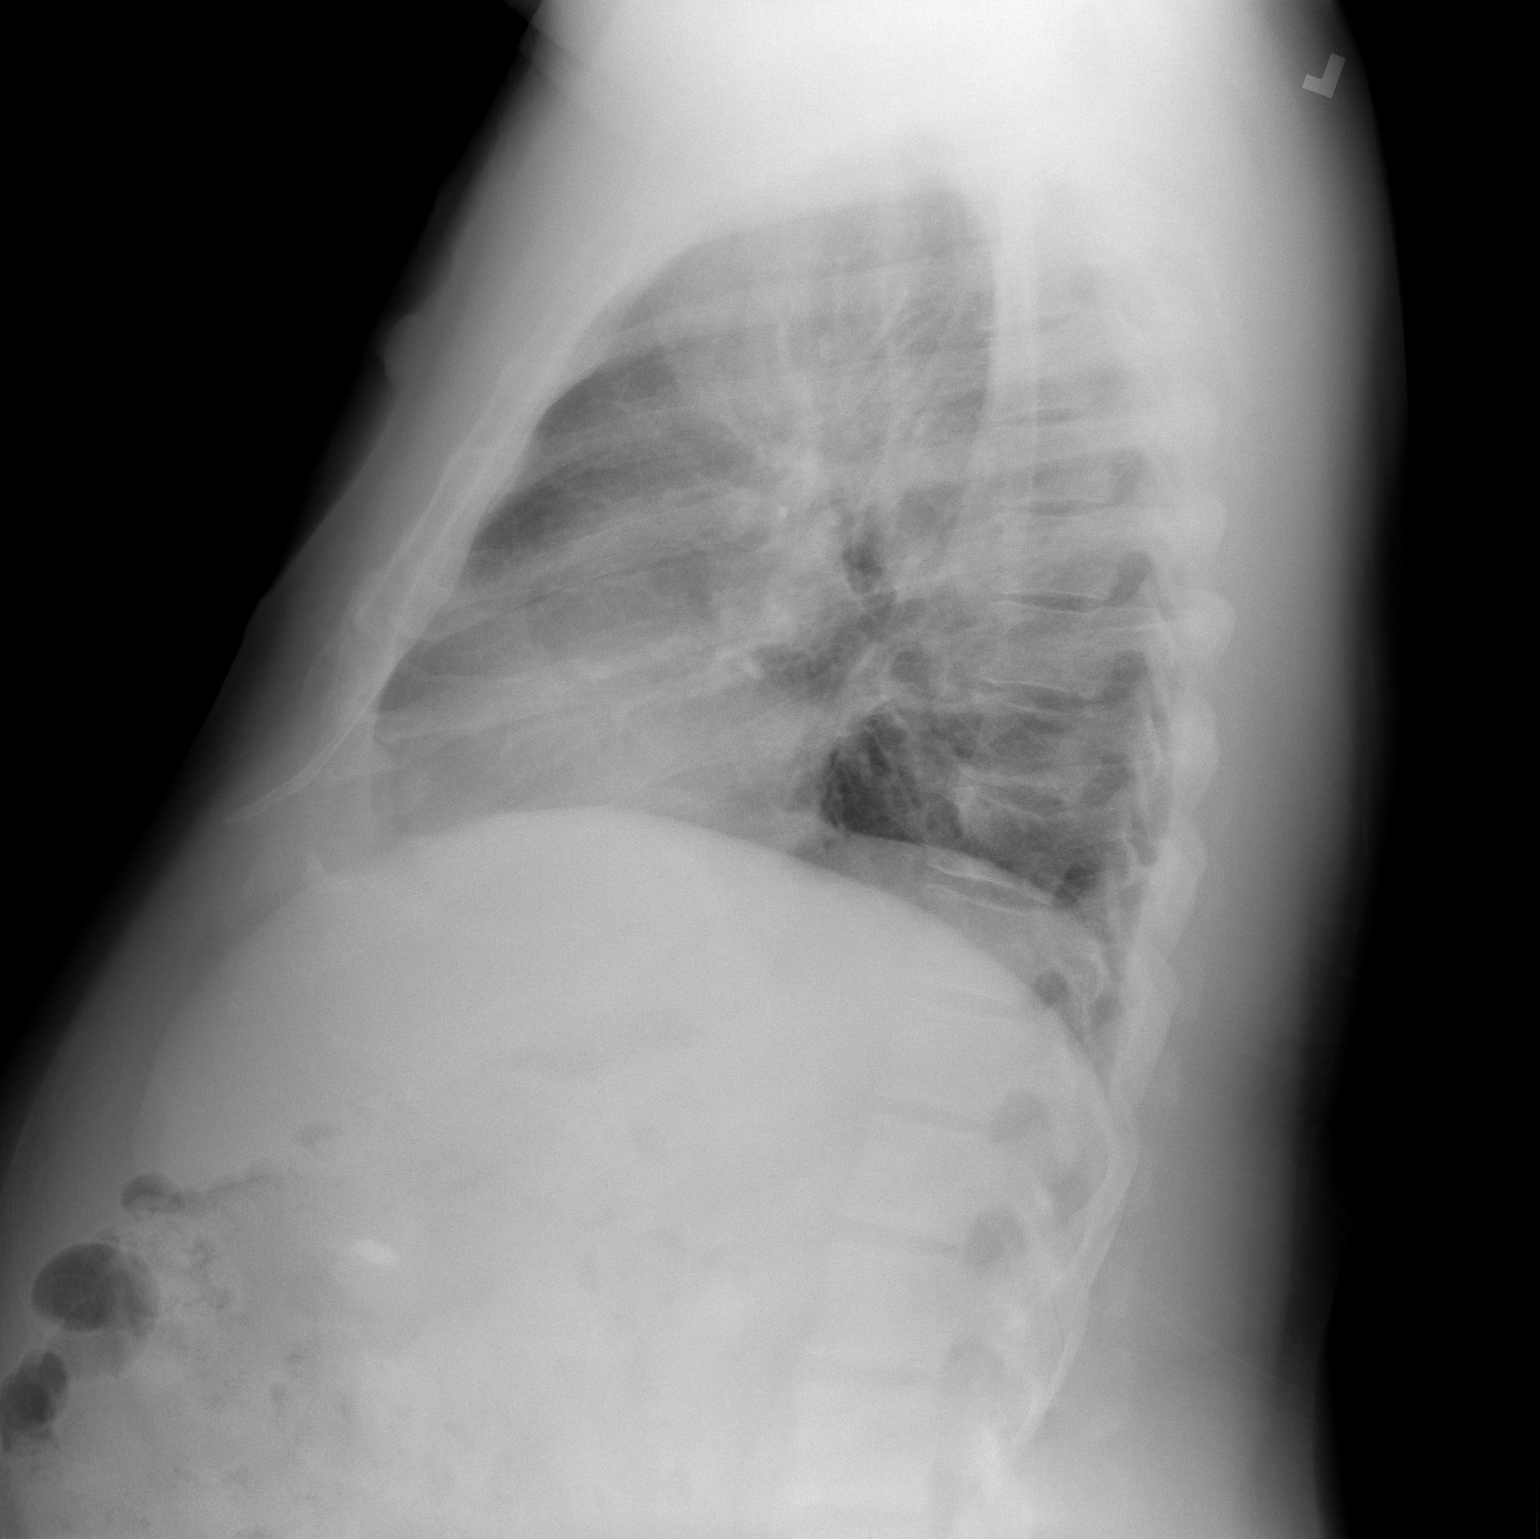

[2 of 2 positions shown; findings below may reference images not displayed]

FINDINGS: Lung volumes are low. No consolidative airspace disease. No pleural
effusions. No pneumothorax. No pulmonary nodule or mass noted.
Pulmonary vasculature and the cardiomediastinal silhouette are
within normal limits.
IMPRESSION: 1. Low lung volumes without radiographic evidence of acute
cardiopulmonary disease.

## 2022-04-12 ENCOUNTER — Encounter (HOSPITAL_BASED_OUTPATIENT_CLINIC_OR_DEPARTMENT_OTHER): Payer: Self-pay

## 2022-04-12 ENCOUNTER — Emergency Department (HOSPITAL_BASED_OUTPATIENT_CLINIC_OR_DEPARTMENT_OTHER): Payer: No Typology Code available for payment source

## 2022-04-12 ENCOUNTER — Other Ambulatory Visit: Payer: Self-pay

## 2022-04-12 ENCOUNTER — Emergency Department (HOSPITAL_BASED_OUTPATIENT_CLINIC_OR_DEPARTMENT_OTHER)
Admission: EM | Admit: 2022-04-12 | Discharge: 2022-04-13 | Disposition: A | Discharge status: Home / Self Care | Payer: No Typology Code available for payment source | Attending: Emergency Medicine | Admitting: Emergency Medicine

## 2022-04-12 DIAGNOSIS — E119 Type 2 diabetes mellitus without complications: Secondary | ICD-10-CM | POA: Insufficient documentation

## 2022-04-12 DIAGNOSIS — Z79899 Other long term (current) drug therapy: Secondary | ICD-10-CM | POA: Diagnosis not present

## 2022-04-12 DIAGNOSIS — Z7984 Long term (current) use of oral hypoglycemic drugs: Secondary | ICD-10-CM | POA: Insufficient documentation

## 2022-04-12 DIAGNOSIS — R109 Unspecified abdominal pain: Secondary | ICD-10-CM | POA: Insufficient documentation

## 2022-04-12 DIAGNOSIS — R739 Hyperglycemia, unspecified: Secondary | ICD-10-CM

## 2022-04-12 DIAGNOSIS — I10 Essential (primary) hypertension: Secondary | ICD-10-CM | POA: Diagnosis not present

## 2022-04-12 LAB — COMPREHENSIVE METABOLIC PANEL
ALT: 24 U/L (ref 0–44)
AST: 26 U/L (ref 15–41)
Albumin: 3.9 g/dL (ref 3.5–5.0)
Alkaline Phosphatase: 95 U/L (ref 38–126)
Anion gap: 10 (ref 5–15)
BUN: 16 mg/dL (ref 6–20)
CO2: 24 mmol/L (ref 22–32)
Calcium: 9.1 mg/dL (ref 8.9–10.3)
Chloride: 94 mmol/L — ABNORMAL LOW (ref 98–111)
Creatinine, Ser: 1.03 mg/dL (ref 0.61–1.24)
GFR, Estimated: >60 mL/min (ref >60)
Glucose, Bld: 515 mg/dL (ref 70–99)
Potassium: 3.6 mmol/L (ref 3.5–5.1)
Sodium: 128 mmol/L — ABNORMAL LOW (ref 135–145)
Total Bilirubin: 0.5 mg/dL (ref 0.3–1.2)
Total Protein: 7.1 g/dL (ref 6.5–8.1)

## 2022-04-12 LAB — MAGNESIUM: Magnesium: 1.8 mg/dL (ref 1.7–2.4)

## 2022-04-12 LAB — URINALYSIS, ROUTINE W REFLEX MICROSCOPIC
Bilirubin Urine: NEGATIVE
Glucose, UA: >=500 mg/dL — AB
Hgb urine dipstick: NEGATIVE
Ketones, ur: NEGATIVE mg/dL
Leukocytes,Ua: NEGATIVE
Nitrite: NEGATIVE
Protein, ur: NEGATIVE mg/dL
Specific Gravity, Urine: 1.01 (ref 1.005–1.030)
pH: 5 (ref 5.0–8.0)

## 2022-04-12 LAB — CBC
HCT: 44.6 % (ref 39.0–52.0)
Hemoglobin: 15.1 g/dL (ref 13.0–17.0)
MCH: 28.1 pg (ref 26.0–34.0)
MCHC: 33.9 g/dL (ref 30.0–36.0)
MCV: 82.9 fL (ref 80.0–100.0)
Platelets: 206 10*3/uL (ref 150–400)
RBC: 5.38 MIL/uL (ref 4.22–5.81)
RDW: 14 % (ref 11.5–15.5)
WBC: 7.9 10*3/uL (ref 4.0–10.5)
nRBC: 0 % (ref 0.0–0.2)

## 2022-04-12 LAB — URINALYSIS, MICROSCOPIC (REFLEX)
RBC / HPF: NONE SEEN RBC/hpf (ref 0–5)
Squamous Epithelial / HPF: NONE SEEN /HPF (ref 0–5)

## 2022-04-12 LAB — CBG MONITORING, ED: Glucose-Capillary: 312 mg/dL — ABNORMAL HIGH (ref 70–99)

## 2022-04-12 LAB — LIPASE, BLOOD: Lipase: 24 U/L (ref 11–51)

## 2022-04-12 MED ORDER — POTASSIUM CHLORIDE CRYS ER 20 MEQ PO TBCR
40.0000 meq | EXTENDED_RELEASE_TABLET | Freq: Once | ORAL | Status: AC
  Administered 2022-04-12: 40 meq via ORAL
  Filled 2022-04-12: qty 2

## 2022-04-12 MED ORDER — KETOROLAC TROMETHAMINE 15 MG/ML IJ SOLN
15.0000 mg | Freq: Once | INTRAMUSCULAR | Status: AC
  Administered 2022-04-12: 15 mg via INTRAVENOUS
  Filled 2022-04-12: qty 1

## 2022-04-12 MED ORDER — INSULIN ASPART 100 UNIT/ML IJ SOLN: 10.0000 [IU] | Freq: Once | INTRAMUSCULAR | Status: DC

## 2022-04-12 MED ORDER — SODIUM CHLORIDE 0.9 % IV BOLUS
1000.0000 mL | Freq: Once | INTRAVENOUS | Status: AC
  Administered 2022-04-12: 1000 mL via INTRAVENOUS

## 2022-04-12 NOTE — ED Triage Notes (Signed)
Pt reports RLQ and LLQ abd pain that radiates to bilateral flank areas, onset 3 days ago. He denies n/v/d/dysuria/hematuria or any other abnormal urinary symptoms.

## 2022-04-12 NOTE — ED Provider Notes (Signed)
[facility] Provider Note   CSN: [id number] Arrival date & time: 04/12/22  1716     History  Chief Complaint  Patient presents with   Abdominal Pain    Richard Le is a 52 y.o. male with medical history of diabetes, hypertension, IBS.  Patient presents to ED for evaluation of bilateral flank pain.  Patient reports that beginning early Tuesday morning, Wednesday he developed pain in his right and left flanks.  Patient reports that pain wraps around to his abdomen, comes and goes.  Patient describes this pain as a burning sensation.  Patient reports that drinking cranberry juice helps this, denies any aggravating factors.  Patient denies urinary symptoms to include penile discharge, dysuria.  Patient denies nausea, vomiting, diarrhea.  Patient denies fevers, chest pain or shortness of breath.  Patient reports that he takes metformin for diabetes once a day, is compliant, states he saw his PCP 2 weeks ago.    Abdominal Pain Associated symptoms: no chest pain, no diarrhea, no dysuria, no fever, no nausea, no shortness of breath and no vomiting        Home Medications Prior to Admission medications   Medication Sig Start Date End Date Taking? Authorizing Provider  amlodipine-atorvastatin (CADUET) 10-10 MG tablet Take 1 tablet by mouth daily.    [provider]  azithromycin (ZITHROMAX) 250 MG tablet Take 1 tablet (250 mg total) by mouth daily. Take first 2 tablets together, then 1 every day until finished. 03/10/21   [name], MD  benzonatate (TESSALON) 100 MG capsule Take 1 capsule (100 mg total) by mouth every 8 (eight) hours. 05/12/21   Deno Etienne, DO  diclofenac Sodium (VOLTAREN) 1 % GEL  03/25/19   [provider]  doxycycline (VIBRAMYCIN) 100 MG capsule Take 1 capsule (100 mg total) by mouth 2 (two) times daily. 05/12/21   Deno Etienne, DO  ergocalciferol (VITAMIN D2) 1.25 MG (50000 UT) capsule Take 1 capsule by  mouth once a week. 06/17/19   [provider]  fluticasone Asencion Islam) 50 MCG/ACT nasal spray  06/15/19   [provider]  fluticasone (VERAMYST) 27.5 MCG/SPRAY nasal spray Place 2 sprays into each nostril daily. 04/10/19   Molpus, John, MD  lisinopril (PRINIVIL,ZESTRIL) 20 MG tablet Take 20 mg by mouth daily.    [provider]  metFORMIN (GLUCOPHAGE) 500 MG tablet Take 1 tablet (500 mg total) by mouth 2 (two) times daily with a meal. 08/23/21   Deno Etienne, DO  potassium chloride (KLOR-CON) 20 MEQ packet Take by mouth 2 (two) times daily.    [provider]  pravastatin (PRAVACHOL) 20 MG tablet Take 20 mg by mouth daily.    [provider]  predniSONE (DELTASONE) 20 MG tablet Take 2 tablets (40 mg total) by mouth daily. 03/10/21   [name], MD  dicyclomine (BENTYL) 10 MG capsule Take 1 capsule (10 mg total) by mouth 4 (four) times daily -  before meals and at bedtime. 12/23/17 04/10/19  Quintella Reichert, MD      Allergies    Penicillins    Review of Systems   Review of Systems  Constitutional:  Negative for fever.  Respiratory:  Negative for shortness of breath.   Cardiovascular:  Negative for chest pain.  Gastrointestinal:  Negative for abdominal pain, diarrhea, nausea and vomiting.  Genitourinary:  Positive for flank pain. Negative for dysuria and penile discharge.  All other systems reviewed and are negative.  Physical Exam Updated Vital Signs BP (!) 137/93   Pulse 69   Temp 97.6 F (36.4 C) (Oral)   Resp 16   Ht 5\' 7"  (1.702 m)   Wt 102.5 kg   SpO2 98%   BMI 35.40 kg/m  Physical Exam Vitals and nursing note reviewed.  Constitutional:      General: He is not in acute distress.    Appearance: Normal appearance. He is not ill-appearing, toxic-appearing or diaphoretic.  HENT:     Head: Normocephalic and atraumatic.     Nose: Nose normal. No congestion.     Mouth/Throat:     Mouth: Mucous membranes are moist.     Pharynx:  Oropharynx is clear.  Eyes:     Extraocular Movements: Extraocular movements intact.     Conjunctiva/sclera: Conjunctivae normal.     Pupils: Pupils are equal, round, and reactive to light.  Cardiovascular:     Rate and Rhythm: Normal rate and regular rhythm.  Pulmonary:     Effort: Pulmonary effort is normal.     Breath sounds: Normal breath sounds. No wheezing.  Abdominal:     General: Abdomen is flat. Bowel sounds are normal.     Palpations: Abdomen is soft.     Tenderness: There is no abdominal tenderness. There is left CVA tenderness. There is no right CVA tenderness.  Musculoskeletal:     Cervical back: Normal range of motion and neck supple. No tenderness.  Skin:    General: Skin is warm and dry.     Capillary Refill: Capillary refill takes less than 2 seconds.  Neurological:     Mental Status: He is alert and oriented to person, place, and time.     ED Results / Procedures / Treatments   Labs (all labs ordered are listed, but only abnormal results are displayed) Labs Reviewed  COMPREHENSIVE METABOLIC PANEL - Abnormal; Notable for the following components:      Result Value   Sodium 128 (*)    Chloride 94 (*)    Glucose, Bld 515 (*)    All other components within normal limits  URINALYSIS, ROUTINE W REFLEX MICROSCOPIC - Abnormal; Notable for the following components:   Glucose, UA >=500 (*)    All other components within normal limits  URINALYSIS, MICROSCOPIC (REFLEX) - Abnormal; Notable for the following components:   Bacteria, UA RARE (*)    All other components within normal limits  CBG MONITORING, ED - Abnormal; Notable for the following components:   Glucose-Capillary 312 (*)    All other components within normal limits  LIPASE, BLOOD  CBC  MAGNESIUM    EKG None  Radiology CT Renal Stone Study  Result Date: 04/12/2022 CLINICAL DATA:  Right lower quadrant and left lower quadrant abdominal pain radiating to both flank areas for 3 days. Abdominal and  flank pain with stone suspected. EXAM: CT ABDOMEN AND PELVIS WITHOUT CONTRAST TECHNIQUE: Multidetector CT imaging of the abdomen and pelvis was performed following the standard protocol without IV contrast. RADIATION DOSE REDUCTION: This exam was performed according to the departmental dose-optimization program which includes automated exposure control, adjustment of the mA and/or kV according to patient size and/or use of iterative reconstruction technique. COMPARISON:  CT abdomen and pelvis 05/15/2008 FINDINGS: Lower chest: Lung bases are clear. Hepatobiliary: No focal liver abnormality is seen. No gallstones, gallbladder wall thickening, or biliary dilatation. Pancreas: Unremarkable. No pancreatic ductal dilatation or surrounding inflammatory changes. Spleen: Normal in size without focal abnormality. Adrenals/Urinary Tract: Adrenal glands  are unremarkable. Kidneys are normal, without renal calculi, focal lesion, or hydronephrosis. Bladder is unremarkable. Stomach/Bowel: Stomach is within normal limits. Appendix appears normal. No evidence of bowel wall thickening, distention, or inflammatory changes. High density material in the stomach is likely ingested opaque medication. Vascular/Lymphatic: No significant vascular findings are present. No enlarged abdominal or pelvic lymph nodes. Reproductive: Prostate is unremarkable. Other: No abdominal wall hernia or abnormality. No abdominopelvic ascites. Musculoskeletal: Degenerative changes in the spine. IMPRESSION: No renal or ureteral stone or obstruction. Electronically Signed   By: Lucienne Capers M.D.   On: 04/12/2022 23:35    Procedures Procedures   Medications Ordered in ED Medications  sodium chloride 0.9 % bolus 1,000 mL (1,000 mLs Intravenous New Bag/Given 04/12/22 2311)  potassium chloride SA (KLOR-CON M) CR tablet 40 mEq (40 mEq Oral Given 04/12/22 2309)  ketorolac (TORADOL) 15 MG/ML injection 15 mg (15 mg Intravenous Given 04/12/22 2348)    ED Course/  Medical Decision Making/ A&P  Medical Decision Making Amount and/or Complexity of Data Reviewed Labs: ordered. Radiology: ordered.  Risk Prescription drug management.   52 year old male presents to ED for evaluation.  Please see HPI for further details.  On examination patient afebrile and nontachycardic.  Patient lung sounds clear bilaterally, he is not hypoxic on room air.  Abdomen soft and compressible.  Normal neurological examination.  Workup initiated in triage include CBC, CMP, urinalysis, lipase, CBG monitoring, EKG.  I have added on magnesium, CT renal stone study  CMP shows decreased sodium to 128, elevated glucose to 515.  Patient reports he takes metformin once daily, states he is compliant.  No anion gap elevation, patient not in DKA.  CBC unremarkable without leukocytosis or anemia.  Magnesium 1.8.  Urinalysis unremarkable.  Lipase within normal limits.  Repeat CBG taken after CMP shows decreased glucose to 312.  EKG nonischemic.  Patient given 1 L fluid, 50 mg Toradol, 40 mEq oral potassium at this time.  Due to flank pain will add on CT renal stone study.  CT renal stone study unremarkable.  After discussion, patient reports that pain is actually in low back, he reports that most likely this is due to musculoskeletal issues.  He reports history of back pain.  At this time, patient will be discharged back to PCP for further management.  Patient advised to begin taking metformin twice daily.  Patient given return precautions and he voiced understanding.  Patient had all of his questions answered his satisfaction.  The patient is safe for discharge.   Final Clinical Impression(s) / ED Diagnoses Final diagnoses:  Hyperglycemia  Flank pain    Rx / DC Orders ED Discharge Orders     None         Azucena Cecil, PA-C 04/13/22 0004    Gareth Morgan, MD 04/14/22 [phone]

## 2022-04-13 NOTE — ED Notes (Signed)
Pt A&Ox4 ambulatory at d/c with independent steady gait. Pt verbalized understanding of d/c instructions and follow up care. 

## 2022-04-13 NOTE — Discharge Instructions (Signed)
Return to the ED with any new symptoms Please follow-up with PCP Please begin taking metformin twice daily as we discussed

## 2022-04-23 IMAGING — DX DG CHEST 1V PORT
1 series · 1 of 1 positions shown · non-contrast
Comparison: Chest x-ray 03/10/2021.

CLINICAL DATA: Cough and chest pain.

EXAM:
PORTABLE CHEST 1 VIEW

[chest ap]
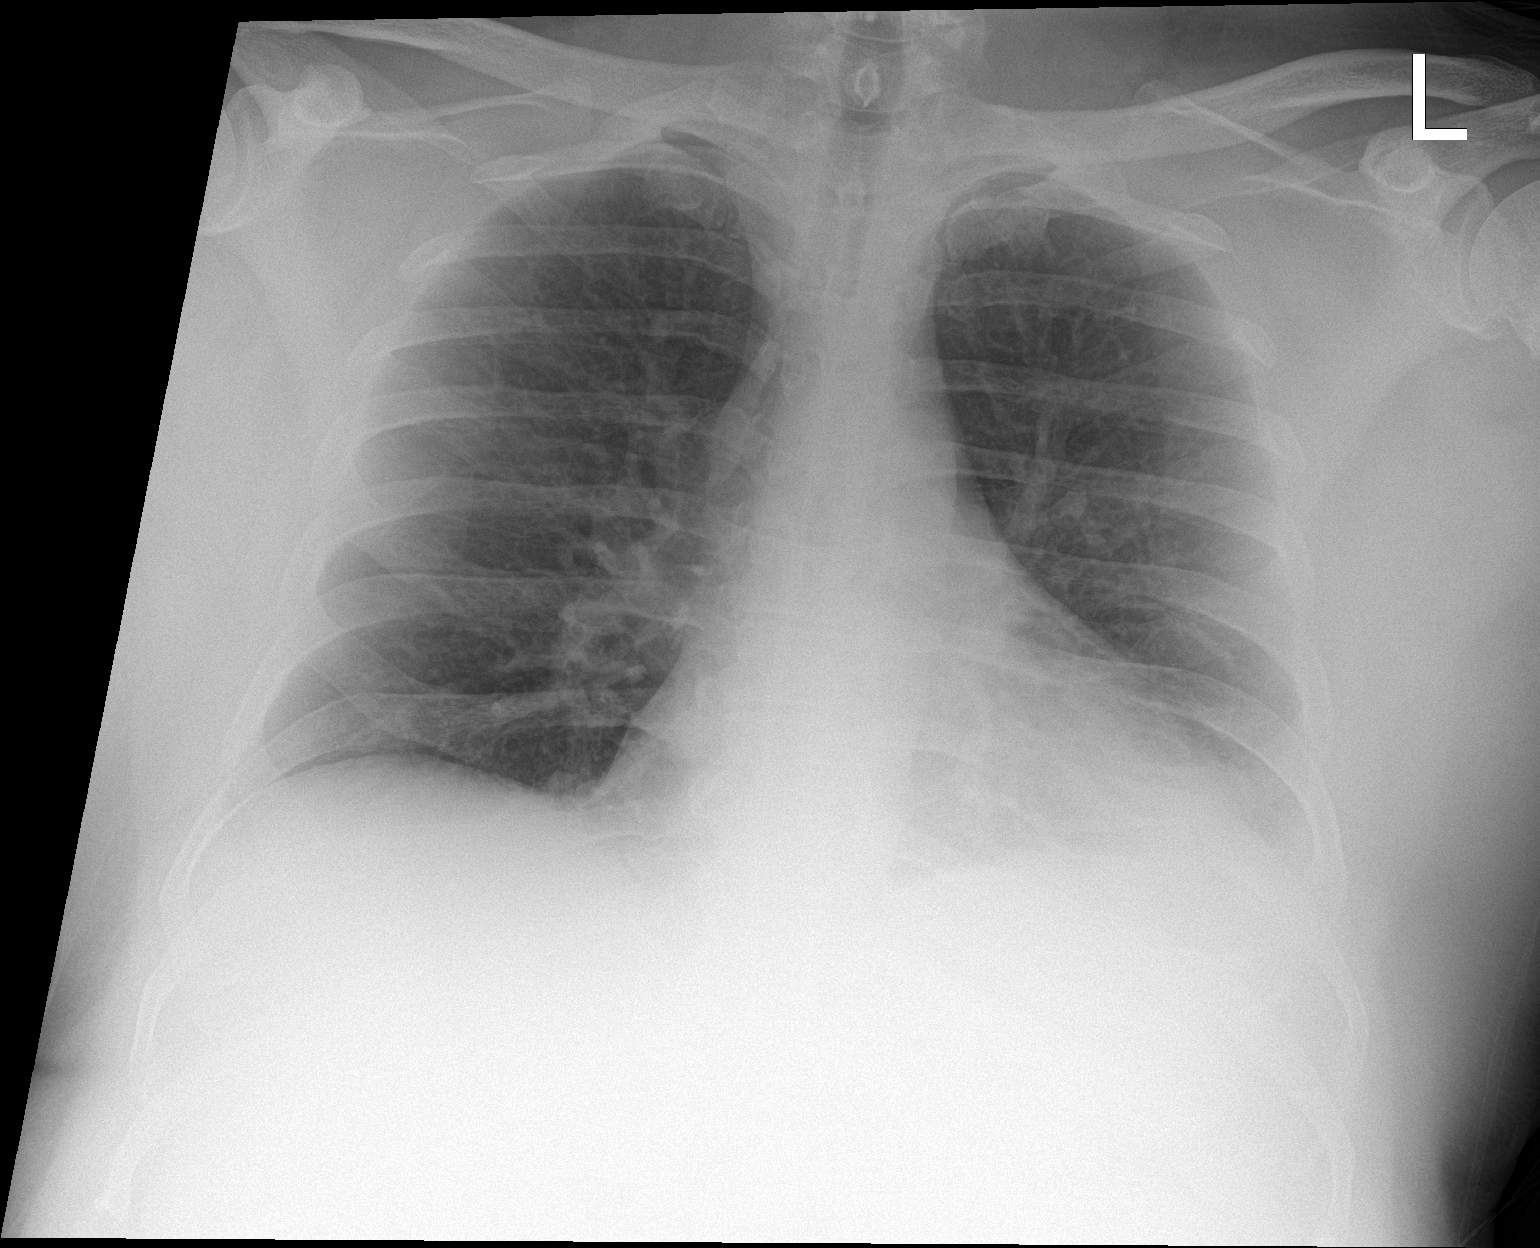

[1 of 1 positions shown; findings below may reference images not displayed]

FINDINGS: There are minimal patchy opacities in the left lung base with small
left pleural effusion. Right lung is clear. Cardiomediastinal
silhouette is within normal limits. No acute fractures are seen.
IMPRESSION: 1. Minimal left basilar airspace disease with small left pleural
effusion.

## 2023-03-16 ENCOUNTER — Other Ambulatory Visit: Payer: Self-pay

## 2023-03-16 ENCOUNTER — Encounter (HOSPITAL_BASED_OUTPATIENT_CLINIC_OR_DEPARTMENT_OTHER): Payer: Self-pay | Admitting: Emergency Medicine

## 2023-03-16 ENCOUNTER — Emergency Department (HOSPITAL_BASED_OUTPATIENT_CLINIC_OR_DEPARTMENT_OTHER): Payer: No Typology Code available for payment source

## 2023-03-16 ENCOUNTER — Emergency Department (HOSPITAL_BASED_OUTPATIENT_CLINIC_OR_DEPARTMENT_OTHER)
Admission: EM | Admit: 2023-03-16 | Discharge: 2023-03-16 | Disposition: A | Discharge status: Home / Self Care | Payer: No Typology Code available for payment source | Attending: Emergency Medicine | Admitting: Emergency Medicine

## 2023-03-16 DIAGNOSIS — Z79899 Other long term (current) drug therapy: Secondary | ICD-10-CM | POA: Diagnosis not present

## 2023-03-16 DIAGNOSIS — R197 Diarrhea, unspecified: Secondary | ICD-10-CM | POA: Insufficient documentation

## 2023-03-16 DIAGNOSIS — I10 Essential (primary) hypertension: Secondary | ICD-10-CM | POA: Diagnosis not present

## 2023-03-16 DIAGNOSIS — E119 Type 2 diabetes mellitus without complications: Secondary | ICD-10-CM | POA: Insufficient documentation

## 2023-03-16 DIAGNOSIS — Z7984 Long term (current) use of oral hypoglycemic drugs: Secondary | ICD-10-CM | POA: Insufficient documentation

## 2023-03-16 DIAGNOSIS — R110 Nausea: Secondary | ICD-10-CM | POA: Diagnosis not present

## 2023-03-16 DIAGNOSIS — R140 Abdominal distension (gaseous): Secondary | ICD-10-CM | POA: Insufficient documentation

## 2023-03-16 DIAGNOSIS — R1084 Generalized abdominal pain: Secondary | ICD-10-CM | POA: Insufficient documentation

## 2023-03-16 LAB — URINALYSIS, ROUTINE W REFLEX MICROSCOPIC
Bilirubin Urine: NEGATIVE
Glucose, UA: >=500 mg/dL — AB
Hgb urine dipstick: NEGATIVE
Ketones, ur: 15 mg/dL — AB
Leukocytes,Ua: NEGATIVE
Nitrite: NEGATIVE
Protein, ur: NEGATIVE mg/dL
Specific Gravity, Urine: 1.015 (ref 1.005–1.030)
pH: 5.5 (ref 5.0–8.0)

## 2023-03-16 LAB — COMPREHENSIVE METABOLIC PANEL
ALT: 25 U/L (ref 0–44)
AST: 22 U/L (ref 15–41)
Albumin: 4 g/dL (ref 3.5–5.0)
Alkaline Phosphatase: 72 U/L (ref 38–126)
Anion gap: 10 (ref 5–15)
BUN: 19 mg/dL (ref 6–20)
CO2: 23 mmol/L (ref 22–32)
Calcium: 8.7 mg/dL — ABNORMAL LOW (ref 8.9–10.3)
Chloride: 100 mmol/L (ref 98–111)
Creatinine, Ser: 1.04 mg/dL (ref 0.61–1.24)
GFR, Estimated: >60 mL/min (ref >60)
Glucose, Bld: 174 mg/dL — ABNORMAL HIGH (ref 70–99)
Potassium: 3.4 mmol/L — ABNORMAL LOW (ref 3.5–5.1)
Sodium: 133 mmol/L — ABNORMAL LOW (ref 135–145)
Total Bilirubin: 0.7 mg/dL (ref 0.0–1.2)
Total Protein: 6.8 g/dL (ref 6.5–8.1)

## 2023-03-16 LAB — CBC
HCT: 47.9 % (ref 39.0–52.0)
Hemoglobin: 16.4 g/dL (ref 13.0–17.0)
MCH: 28.4 pg (ref 26.0–34.0)
MCHC: 34.2 g/dL (ref 30.0–36.0)
MCV: 82.9 fL (ref 80.0–100.0)
Platelets: 185 10*3/uL (ref 150–400)
RBC: 5.78 MIL/uL (ref 4.22–5.81)
RDW: 15.2 % (ref 11.5–15.5)
WBC: 7.8 10*3/uL (ref 4.0–10.5)
nRBC: 0 % (ref 0.0–0.2)

## 2023-03-16 LAB — URINALYSIS, MICROSCOPIC (REFLEX)

## 2023-03-16 LAB — TROPONIN I (HIGH SENSITIVITY): Troponin I (High Sensitivity): 3 ng/L (ref <18)

## 2023-03-16 LAB — LIPASE, BLOOD: Lipase: 25 U/L (ref 11–51)

## 2023-03-16 MED ORDER — DICYCLOMINE HCL 10 MG/ML IM SOLN
20.0000 mg | Freq: Once | INTRAMUSCULAR | Status: AC
  Administered 2023-03-16: 20 mg via INTRAMUSCULAR
  Filled 2023-03-16: qty 2

## 2023-03-16 MED ORDER — ONDANSETRON 4 MG PO TBDP: 4.0000 mg | ORAL_TABLET | Freq: Three times a day (TID) | ORAL | 0 refills | Status: AC | PRN

## 2023-03-16 MED ORDER — CHLORPROMAZINE HCL 25 MG PO TABS
25.0000 mg | ORAL_TABLET | Freq: Once | ORAL | Status: AC
  Administered 2023-03-16: 25 mg via ORAL
  Filled 2023-03-16: qty 1

## 2023-03-16 MED ORDER — ALUM & MAG HYDROXIDE-SIMETH 200-200-20 MG/5ML PO SUSP
30.0000 mL | Freq: Once | ORAL | Status: AC
  Administered 2023-03-16: 30 mL via ORAL
  Filled 2023-03-16: qty 30

## 2023-03-16 MED ORDER — DICYCLOMINE HCL 20 MG PO TABS: 20.0000 mg | ORAL_TABLET | Freq: Three times a day (TID) | ORAL | 0 refills | Status: AC

## 2023-03-16 MED ORDER — LIDOCAINE VISCOUS HCL 2 % MT SOLN
15.0000 mL | Freq: Once | OROMUCOSAL | Status: AC
  Administered 2023-03-16: 15 mL via OROMUCOSAL
  Filled 2023-03-16: qty 15

## 2023-03-16 MED ORDER — IOHEXOL 300 MG/ML  SOLN
100.0000 mL | Freq: Once | INTRAMUSCULAR | Status: AC | PRN
  Administered 2023-03-16: 100 mL via INTRAVENOUS

## 2023-03-16 NOTE — Discharge Instructions (Addendum)
 Your testing is reassuring.  Take the stomach medication as prescribed.  Start with a clear liquid diet and advance slowly as tolerated.  Return to the ED with new or worsening symptoms.

## 2023-03-16 NOTE — ED Provider Notes (Signed)
 [place] [facility] Provider Note   CSN: [id number] Arrival date & time: 03/16/23  0114     History  Chief Complaint  Patient presents with   Abdominal Pain    Richard Le is a 53 y.o. male.  Patient with a history of IBS, hypertension, glaucoma, diabetes presents with abdominal pain, distention and bloating with belching.  States for the past 2 days she has had abdominal distention with pain to both sides of his abdomen feeling full and bloated.  Having frequent belching and hiccups.  Nausea but no vomiting.  2 episodes of nonbloody diarrhea.  Last normal bowel was 2 days ago.  No chest pain or shortness of breath.  No fever.  No pain with urination or blood in the urine.  No previous abdominal surgeries.  Does have a history of IBS and this feels similar.  Nothing makes the pain better or worse.  Still passing gas.  No chest pain.  No pain with urination or blood in the urine.  The history is provided by the patient.  Abdominal Pain Associated symptoms: diarrhea and nausea   Associated symptoms: no chest pain, no cough, no dysuria, no fatigue, no fever, no hematuria, no shortness of breath and no vomiting        Home Medications Prior to Admission medications   Medication Sig Start Date End Date Taking? Authorizing Provider  amlodipine-atorvastatin (CADUET) 10-10 MG tablet Take 1 tablet by mouth daily.    [provider]  azithromycin  (ZITHROMAX ) 250 MG tablet Take 1 tablet (250 mg total) by mouth daily. Take first 2 tablets together, then 1 every day until finished. 03/10/21   Towana Ozell BROCKS, MD  benzonatate  (TESSALON ) 100 MG capsule Take 1 capsule (100 mg total) by mouth every 8 (eight) hours. 05/12/21   Emil Share, DO  diclofenac Sodium (VOLTAREN) 1 % GEL  03/25/19   [provider]  doxycycline  (VIBRAMYCIN ) 100 MG capsule Take 1 capsule (100 mg total) by mouth 2 (two) times daily. 05/12/21   Emil Share, DO   ergocalciferol (VITAMIN D2) 1.25 MG (50000 UT) capsule Take 1 capsule by mouth once a week. 06/17/19   [provider]  fluticasone  OREN) 50 MCG/ACT nasal spray  06/15/19   [provider]  fluticasone  (VERAMYST) 27.5 MCG/SPRAY nasal spray Place 2 sprays into each nostril daily. 04/10/19   Molpus, John, MD  lisinopril (PRINIVIL,ZESTRIL) 20 MG tablet Take 20 mg by mouth daily.    [provider]  metFORMIN  (GLUCOPHAGE ) 500 MG tablet Take 1 tablet (500 mg total) by mouth 2 (two) times daily with a meal. 08/23/21   Emil Share, DO  potassium chloride  (KLOR-CON ) 20 MEQ packet Take by mouth 2 (two) times daily.    [provider]  pravastatin (PRAVACHOL) 20 MG tablet Take 20 mg by mouth daily.    [provider]  predniSONE  (DELTASONE ) 20 MG tablet Take 2 tablets (40 mg total) by mouth daily. 03/10/21   Towana Ozell BROCKS, MD  dicyclomine  (BENTYL ) 10 MG capsule Take 1 capsule (10 mg total) by mouth 4 (four) times daily -  before meals and at bedtime. 12/23/17 04/10/19  Griselda Norris, MD      Allergies    Penicillins    Review of Systems   Review of Systems  Constitutional:  Negative for activity change, appetite change, fatigue and fever.  HENT:  Negative for congestion and rhinorrhea.   Respiratory:  Negative for cough, chest tightness  and shortness of breath.   Cardiovascular:  Negative for chest pain and leg swelling.  Gastrointestinal:  Positive for abdominal pain, diarrhea and nausea. Negative for vomiting.  Genitourinary:  Negative for dysuria and hematuria.  Musculoskeletal:  Negative for arthralgias and myalgias.  Skin:  Negative for wound.  Neurological:  Negative for dizziness, weakness and headaches.   all other systems are negative except as noted in the HPI and PMH.    Physical Exam Updated Vital Signs BP (!) 138/95 (BP Location: Right Arm)   Pulse (!) 101   Temp 98.9 F (37.2 C) (Oral)   Resp 20   Ht 5' 7 (1.702 m)   Wt 93.4 kg    SpO2 96%   BMI 32.26 kg/m  Physical Exam Vitals and nursing note reviewed.  Constitutional:      General: He is not in acute distress.    Appearance: He is well-developed.     Comments: Frequent hiccups  HENT:     Head: Normocephalic and atraumatic.     Mouth/Throat:     Pharynx: No oropharyngeal exudate.  Eyes:     Conjunctiva/sclera: Conjunctivae normal.     Pupils: Pupils are equal, round, and reactive to light.  Neck:     Comments: No meningismus. Cardiovascular:     Rate and Rhythm: Normal rate and regular rhythm.     Heart sounds: Normal heart sounds. No murmur heard. Pulmonary:     Effort: Pulmonary effort is normal. No respiratory distress.     Breath sounds: Normal breath sounds.  Abdominal:     General: There is distension.     Palpations: Abdomen is soft.     Tenderness: There is abdominal tenderness. There is no guarding or rebound.     Comments: Diffuse tenderness with distention, no guarding or rebound  Musculoskeletal:        General: No tenderness. Normal range of motion.     Cervical back: Normal range of motion and neck supple.  Skin:    General: Skin is warm.  Neurological:     Mental Status: He is alert and oriented to person, place, and time.     Cranial Nerves: No cranial nerve deficit.     Motor: No abnormal muscle tone.     Coordination: Coordination normal.     Comments:  5/5 strength throughout. CN 2-12 intact.Equal grip strength.   Psychiatric:        Behavior: Behavior normal.     ED Results / Procedures / Treatments   Labs (all labs ordered are listed, but only abnormal results are displayed) Labs Reviewed  COMPREHENSIVE METABOLIC PANEL - Abnormal; Notable for the following components:      Result Value   Sodium 133 (*)    Potassium 3.4 (*)    Glucose, Bld 174 (*)    Calcium 8.7 (*)    All other components within normal limits  URINALYSIS, ROUTINE W REFLEX MICROSCOPIC - Abnormal; Notable for the following components:   Glucose,  UA >=500 (*)    Ketones, ur 15 (*)    All other components within normal limits  URINALYSIS, MICROSCOPIC (REFLEX) - Abnormal; Notable for the following components:   Bacteria, UA RARE (*)    All other components within normal limits  LIPASE, BLOOD  CBC  TROPONIN I (HIGH SENSITIVITY)  TROPONIN I (HIGH SENSITIVITY)    EKG None  Radiology CT ABDOMEN PELVIS W CONTRAST Result Date: 03/16/2023 CLINICAL DATA:  Abdominal pain across both sides of abdomen. Belching.  Nausea. EXAM: CT ABDOMEN AND PELVIS WITH CONTRAST TECHNIQUE: Multidetector CT imaging of the abdomen and pelvis was performed using the standard protocol following bolus administration of intravenous contrast. RADIATION DOSE REDUCTION: This exam was performed according to the departmental dose-optimization program which includes automated exposure control, adjustment of the mA and/or kV according to patient size and/or use of iterative reconstruction technique. CONTRAST:  OMNIPAQUE  IOHEXOL  300 MG/ML  SOLN COMPARISON:  CT 04/12/2022 FINDINGS: Lower chest: No acute abnormality. Hepatobiliary: Hepatic steatosis. Normal gallbladder. No biliary dilation. Pancreas: Unremarkable. Spleen: Unremarkable. Adrenals/Urinary Tract: Normal adrenal glands. No urinary calculi or hydronephrosis. Bladder is unremarkable. Stomach/Bowel: Normal caliber large and small bowel. No bowel wall thickening. The appendix is normal.Stomach is within normal limits. Vascular/Lymphatic: No significant vascular findings are present. No enlarged abdominal or pelvic lymph nodes. Reproductive: Unremarkable. Other: No free intraperitoneal fluid or air. Musculoskeletal: No acute fracture. 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 IMPRESSION: 1. No acute abnormality in the abdomen or pelvis. 2. Hepatic steatosis. Electronically Signed   By: Norman Gatlin M.D.   On: 03/16/2023 03:22    Procedures Procedures    Medications Ordered in ED Medications  alum & mag  hydroxide-simeth (MAALOX/MYLANTA) 200-200-20 MG/5ML suspension 30 mL (has no administration in time range)  lidocaine  (XYLOCAINE ) 2 % viscous mouth solution 15 mL (has no administration in time range)  dicyclomine  (BENTYL ) injection 20 mg (has no administration in time range)  chlorproMAZINE  (THORAZINE ) tablet 25 mg (has no administration in time range)  iohexol  (OMNIPAQUE ) 300 MG/ML solution 100 mL (100 mLs Intravenous Contrast Given 03/16/23 0310)    ED Course/ Medical Decision Making/ A&P                                 Medical Decision Making Amount and/or Complexity of Data Reviewed Labs: ordered. Decision-making details documented in ED Course. Radiology: ordered and independent interpretation performed. Decision-making details documented in ED Course. ECG/medicine tests: ordered and independent interpretation performed. Decision-making details documented in ED Course.  Risk OTC drugs. Prescription drug management.   History of IBS here with abdominal pain and distention x 2 days.  Stable vitals.  No distress.  Abdomen soft without peritoneal signs.  Patient given GI cocktail and Bentyl . Labs obtained in triage are reassuring.  Normal LFTs.  Normal lipase.  Negative urinalysis.  Workup reassuring.  Hyperglycemia without evidence of DKA.  CT scan is negative for acute surgical pathology.  Does show hepatic steatosis.  Abdominal pain has improved with GI cocktail, Bentyl  and Thorazine  for hiccups.  Discussed PCP and GI follow-up.  Will give Bentyl  for home use for abdominal cramps in the setting of likely IBS. Start with a clear liquid diet and advance slowly as tolerated.  Return to the ED with new or worsening symptoms.        Final Clinical Impression(s) / ED Diagnoses Final diagnoses:  Generalized abdominal pain    Rx / DC Orders ED Discharge Orders     None         Kenly Henckel, Garnette, MD 03/16/23 [phone]

## 2023-03-16 NOTE — ED Triage Notes (Signed)
 Reports abd pain across both sides of abd. States he's been belching a lot. He reports nausea, but denies vomiting. States he has had diarrhea and has a hx of IBS. Denies fever. No sick contacts at home.

## 2023-03-16 NOTE — ED Notes (Signed)
 Second trop D/C by EDP

## 2023-03-17 ENCOUNTER — Telehealth (HOSPITAL_BASED_OUTPATIENT_CLINIC_OR_DEPARTMENT_OTHER): Payer: Self-pay

## 2023-03-17 NOTE — Telephone Encounter (Signed)
 Pt requesting to have written prescription to take to [place]. Requested to provider to get written prescription.

## 2023-03-23 ENCOUNTER — Encounter (HOSPITAL_BASED_OUTPATIENT_CLINIC_OR_DEPARTMENT_OTHER): Payer: Self-pay | Admitting: Emergency Medicine

## 2023-03-23 ENCOUNTER — Emergency Department (HOSPITAL_BASED_OUTPATIENT_CLINIC_OR_DEPARTMENT_OTHER)
Admission: EM | Admit: 2023-03-23 | Discharge: 2023-03-23 | Disposition: A | Discharge status: Home / Self Care | Payer: No Typology Code available for payment source | Attending: Student | Admitting: Student

## 2023-03-23 ENCOUNTER — Emergency Department (HOSPITAL_BASED_OUTPATIENT_CLINIC_OR_DEPARTMENT_OTHER): Payer: No Typology Code available for payment source

## 2023-03-23 DIAGNOSIS — R051 Acute cough: Secondary | ICD-10-CM | POA: Diagnosis not present

## 2023-03-23 DIAGNOSIS — I10 Essential (primary) hypertension: Secondary | ICD-10-CM | POA: Insufficient documentation

## 2023-03-23 DIAGNOSIS — Z79899 Other long term (current) drug therapy: Secondary | ICD-10-CM | POA: Insufficient documentation

## 2023-03-23 DIAGNOSIS — M545 Low back pain, unspecified: Secondary | ICD-10-CM | POA: Diagnosis not present

## 2023-03-23 DIAGNOSIS — Z20822 Contact with and (suspected) exposure to covid-19: Secondary | ICD-10-CM | POA: Insufficient documentation

## 2023-03-23 DIAGNOSIS — Z7984 Long term (current) use of oral hypoglycemic drugs: Secondary | ICD-10-CM | POA: Insufficient documentation

## 2023-03-23 DIAGNOSIS — E119 Type 2 diabetes mellitus without complications: Secondary | ICD-10-CM | POA: Diagnosis not present

## 2023-03-23 DIAGNOSIS — F17290 Nicotine dependence, other tobacco product, uncomplicated: Secondary | ICD-10-CM | POA: Diagnosis not present

## 2023-03-23 DIAGNOSIS — R059 Cough, unspecified: Secondary | ICD-10-CM | POA: Diagnosis present

## 2023-03-23 LAB — CBC WITH DIFFERENTIAL/PLATELET
Abs Immature Granulocytes: 0.05 10*3/uL (ref 0.00–0.07)
Basophils Absolute: 0 10*3/uL (ref 0.0–0.1)
Basophils Relative: 1 %
Eosinophils Absolute: 0.1 10*3/uL (ref 0.0–0.5)
Eosinophils Relative: 1 %
HCT: 46.9 % (ref 39.0–52.0)
Hemoglobin: 15.6 g/dL (ref 13.0–17.0)
Immature Granulocytes: 1 %
Lymphocytes Relative: 17 %
Lymphs Abs: 1.4 10*3/uL (ref 0.7–4.0)
MCH: 27.9 pg (ref 26.0–34.0)
MCHC: 33.3 g/dL (ref 30.0–36.0)
MCV: 83.9 fL (ref 80.0–100.0)
Monocytes Absolute: 0.6 10*3/uL (ref 0.1–1.0)
Monocytes Relative: 7 %
Neutro Abs: 6.2 10*3/uL (ref 1.7–7.7)
Neutrophils Relative %: 73 %
Platelets: 207 10*3/uL (ref 150–400)
RBC: 5.59 MIL/uL (ref 4.22–5.81)
RDW: 14.6 % (ref 11.5–15.5)
WBC: 8.4 10*3/uL (ref 4.0–10.5)
nRBC: 0 % (ref 0.0–0.2)

## 2023-03-23 LAB — URINALYSIS, ROUTINE W REFLEX MICROSCOPIC
Bilirubin Urine: NEGATIVE
Glucose, UA: >=500 mg/dL — AB
Hgb urine dipstick: NEGATIVE
Ketones, ur: NEGATIVE mg/dL
Leukocytes,Ua: NEGATIVE
Nitrite: NEGATIVE
Protein, ur: NEGATIVE mg/dL
Specific Gravity, Urine: 1.015 (ref 1.005–1.030)
pH: 5.5 (ref 5.0–8.0)

## 2023-03-23 LAB — URINALYSIS, MICROSCOPIC (REFLEX): RBC / HPF: NONE SEEN RBC/hpf (ref 0–5)

## 2023-03-23 LAB — RESP PANEL BY RT-PCR (RSV, FLU A&B, COVID)  RVPGX2
Influenza A by PCR: NEGATIVE
Influenza B by PCR: NEGATIVE
Resp Syncytial Virus by PCR: NEGATIVE
SARS Coronavirus 2 by RT PCR: NEGATIVE

## 2023-03-23 LAB — BASIC METABOLIC PANEL
Anion gap: 12 (ref 5–15)
BUN: 14 mg/dL (ref 6–20)
CO2: 23 mmol/L (ref 22–32)
Calcium: 8.8 mg/dL — ABNORMAL LOW (ref 8.9–10.3)
Chloride: 99 mmol/L (ref 98–111)
Creatinine, Ser: 1.09 mg/dL (ref 0.61–1.24)
GFR, Estimated: >60 mL/min (ref >60)
Glucose, Bld: 106 mg/dL — ABNORMAL HIGH (ref 70–99)
Potassium: 3.6 mmol/L (ref 3.5–5.1)
Sodium: 134 mmol/L — ABNORMAL LOW (ref 135–145)

## 2023-03-23 MED ORDER — BENZONATATE 100 MG PO CAPS: 100.0000 mg | ORAL_CAPSULE | Freq: Three times a day (TID) | ORAL | 0 refills | Status: AC

## 2023-03-23 MED ORDER — LIDOCAINE 5 % EX PTCH
1.0000 | MEDICATED_PATCH | CUTANEOUS | Status: DC
  Administered 2023-03-23: 1 via TRANSDERMAL
  Filled 2023-03-23: qty 1

## 2023-03-23 MED ORDER — LIDOCAINE 5 % EX PTCH: 1.0000 | MEDICATED_PATCH | CUTANEOUS | 0 refills | Status: AC

## 2023-03-23 MED ORDER — ALBUTEROL SULFATE HFA 108 (90 BASE) MCG/ACT IN AERS
2.0000 | INHALATION_SPRAY | Freq: Once | RESPIRATORY_TRACT | Status: AC
  Administered 2023-03-23: 2 via RESPIRATORY_TRACT
  Filled 2023-03-23: qty 6.7

## 2023-03-23 MED ORDER — LIDOCAINE 5 % EX PTCH: 1.0000 | MEDICATED_PATCH | CUTANEOUS | 0 refills | Status: DC

## 2023-03-23 MED ORDER — KETOROLAC TROMETHAMINE 15 MG/ML IJ SOLN
15.0000 mg | Freq: Once | INTRAMUSCULAR | Status: AC
  Administered 2023-03-23: 15 mg via INTRAMUSCULAR
  Filled 2023-03-23: qty 1

## 2023-03-23 MED ORDER — NAPROXEN 375 MG PO TABS: 375.0000 mg | ORAL_TABLET | Freq: Two times a day (BID) | ORAL | 0 refills | Status: DC

## 2023-03-23 MED ORDER — NAPROXEN 375 MG PO TABS: 375.0000 mg | ORAL_TABLET | Freq: Two times a day (BID) | ORAL | 0 refills | Status: AC

## 2023-03-23 MED ORDER — BENZONATATE 100 MG PO CAPS
100.0000 mg | ORAL_CAPSULE | Freq: Three times a day (TID) | ORAL | 0 refills | Status: DC
Start: 2023-03-23 — End: 2023-03-23

## 2023-03-23 NOTE — ED Triage Notes (Signed)
 Pt with cough and RT lower back pain x 2 days

## 2023-03-23 NOTE — ED Provider Notes (Signed)
 [facility] Provider Note  CSN: [id number] Arrival date & time: 03/23/23 1225  Chief Complaint(s) Cough and Back Pain  HPI Richard Le is a 53 y.o. male with PMH T2DM, HTN, IBS who presents emergency room for evaluation of cough and back pain.  States that over the last 48 hours she has had progressive cough and right paraspinal lumbar back pain.  Has had flareups of this in the past.  No trauma or fall to worsen back pain at home.  Uses an albuterol  inhaler at home but has since run out of this medication.  Currently denies associated chest pain, shortness of breath, headache, fever or other systemic symptoms.  No dysuria or hematuria seen.  No penile discharge.   Past Medical History Past Medical History:  Diagnosis Date   Diabetes mellitus without complication (HCC)    Hypertension    IBS (irritable bowel syndrome)    There are no active problems to display for this patient.  Home Medication(s) Prior to Admission medications   Medication Sig Start Date End Date Taking? Authorizing Provider  benzonatate  (TESSALON ) 100 MG capsule Take 1 capsule (100 mg total) by mouth every 8 (eight) hours. 03/23/23  Yes Brittie Whisnant, MD  lidocaine  (LIDODERM ) 5 % Place 1 patch onto the skin daily. Remove & Discard patch within 12 hours or as directed by MD 03/23/23  Yes Jibril Mcminn, MD  naproxen  (NAPROSYN ) 375 MG tablet Take 1 tablet (375 mg total) by mouth 2 (two) times daily. 03/23/23  Yes Brenna Friesenhahn, MD  amlodipine-atorvastatin (CADUET) 10-10 MG tablet Take 1 tablet by mouth daily.    [provider]  azithromycin  (ZITHROMAX ) 250 MG tablet Take 1 tablet (250 mg total) by mouth daily. Take first 2 tablets together, then 1 every day until finished. 03/10/21   Towana Ozell BROCKS, MD  benzonatate  (TESSALON ) 100 MG capsule Take 1 capsule (100 mg total) by mouth every 8 (eight) hours. 05/12/21   Emil Share, DO  diclofenac Sodium (VOLTAREN)  1 % GEL  03/25/19   [provider]  dicyclomine  (BENTYL ) 20 MG tablet Take 1 tablet (20 mg total) by mouth 4 (four) times daily -  before meals and at bedtime. 03/16/23   Rancour, Garnette, MD  doxycycline  (VIBRAMYCIN ) 100 MG capsule Take 1 capsule (100 mg total) by mouth 2 (two) times daily. 05/12/21   Emil Share, DO  ergocalciferol (VITAMIN D2) 1.25 MG (50000 UT) capsule Take 1 capsule by mouth once a week. 06/17/19   [provider]  fluticasone  OREN) 50 MCG/ACT nasal spray  06/15/19   [provider]  fluticasone  (VERAMYST) 27.5 MCG/SPRAY nasal spray Place 2 sprays into each nostril daily. 04/10/19   Molpus, John, MD  lisinopril (PRINIVIL,ZESTRIL) 20 MG tablet Take 20 mg by mouth daily.    [provider]  metFORMIN  (GLUCOPHAGE ) 500 MG tablet Take 1 tablet (500 mg total) by mouth 2 (two) times daily with a meal. 08/23/21   Emil Share, DO  ondansetron  (ZOFRAN -ODT) 4 MG disintegrating tablet Take 1 tablet (4 mg total) by mouth every 8 (eight) hours as needed. 03/16/23   Rancour, Garnette, MD  potassium chloride  (KLOR-CON ) 20 MEQ packet Take by mouth 2 (two) times daily.    [provider]  pravastatin (PRAVACHOL) 20 MG tablet Take 20 mg by mouth daily.    [provider]  predniSONE  (DELTASONE ) 20 MG tablet Take 2 tablets (40 mg total) by mouth daily. 03/10/21   Towana,  Ozell BROCKS, MD                                                                                                                                    Past Surgical History Past Surgical History:  Procedure Laterality Date   KNEE ARTHROSCOPY     Family History History reviewed. No pertinent family history.  Social History Social History   Tobacco Use   Smoking status: Never   Smokeless tobacco: Current    Types: Chew  Vaping Use   Vaping status: Never Used  Substance Use Topics   Alcohol use: Yes    Comment: occ   Drug use: Never   Allergies Penicillins  Review of  Systems Review of Systems  Respiratory:  Positive for cough.   Musculoskeletal:  Positive for back pain.    Physical Exam Vital Signs  I have reviewed the triage vital signs BP 128/75 (BP Location: Right Arm)   Pulse 94   Temp 98.7 F (37.1 C) (Oral)   Resp 18   Ht 5' 7 (1.702 m)   Wt 93 kg   SpO2 94%   BMI 32.11 kg/m   Physical Exam Constitutional:      General: He is not in acute distress.    Appearance: Normal appearance.  HENT:     Head: Normocephalic and atraumatic.     Nose: No congestion or rhinorrhea.  Eyes:     General:        Right eye: No discharge.        Left eye: No discharge.     Extraocular Movements: Extraocular movements intact.     Pupils: Pupils are equal, round, and reactive to light.  Cardiovascular:     Rate and Rhythm: Normal rate and regular rhythm.     Heart sounds: No murmur heard. Pulmonary:     Effort: No respiratory distress.     Breath sounds: No wheezing or rales.  Abdominal:     General: There is no distension.     Tenderness: There is no abdominal tenderness.  Musculoskeletal:        General: Tenderness present. Normal range of motion.     Cervical back: Normal range of motion.  Skin:    General: Skin is warm and dry.  Neurological:     General: No focal deficit present.     Mental Status: He is alert.     ED Results and Treatments Labs (all labs ordered are listed, but only abnormal results are displayed) Labs Reviewed  BASIC METABOLIC PANEL - Abnormal; Notable for the following components:      Result Value   Sodium 134 (*)    Glucose, Bld 106 (*)    Calcium 8.8 (*)    All other components within normal limits  URINALYSIS, ROUTINE W REFLEX MICROSCOPIC - Abnormal; Notable for the following components:   Glucose, UA >=500 (*)    All other  components within normal limits  URINALYSIS, MICROSCOPIC (REFLEX) - Abnormal; Notable for the following components:   Bacteria, UA FEW (*)    All other components within normal  limits  RESP PANEL BY RT-PCR (RSV, FLU A&B, COVID)  RVPGX2  CBC WITH DIFFERENTIAL/PLATELET                                                                                                                          Radiology DG Chest 2 View Result Date: 03/23/2023 CLINICAL DATA:  Cough EXAM: CHEST - 2 VIEW COMPARISON:  05/12/2021 FINDINGS: The heart size and mediastinal contours are within normal limits. Slightly low lung volumes. Increased bibasilar interstitial markings. No pleural effusion or pneumothorax. The visualized skeletal structures are unremarkable. IMPRESSION: Increased bibasilar interstitial markings, which may reflect bronchitic lung changes versus atelectasis or developing atypical/viral infection. Electronically Signed   By: Mabel Converse D.O.   On: 03/23/2023 14:18    Pertinent labs & imaging results that were available during my care of the patient were reviewed by me and considered in my medical decision making (see MDM for details).  Medications Ordered in ED Medications  lidocaine  (LIDODERM ) 5 % 1 patch (1 patch Transdermal Patch Applied 03/23/23 1546)  ketorolac  (TORADOL ) 15 MG/ML injection 15 mg (15 mg Intramuscular Given 03/23/23 1547)  albuterol  (VENTOLIN  HFA) 108 (90 Base) MCG/ACT inhaler 2 puff (2 puffs Inhalation Given 03/23/23 1542)                                                                                                                                     Procedures Procedures  (including critical care time)  Medical Decision Making / ED Course   This patient presents to the ED for concern of cough, back pain, this involves an extensive number of treatment options, and is a complaint that carries with it a high risk of complications and morbidity.  The differential diagnosis includes unspecified viral URI, influenza, RSV, COVID-19, musculoskeletal back pain, nephrolithiasis, fracture, pneumonia  MDM: Patient in emergency room for evaluation of cough and  back pain.  Physical exam with mild paraspinal lumbar tenderness to palpation but no tenderness over the bony L-spine.  No appreciable wheezing heard on lung exam.  Laboratory evaluation unremarkable outside of a mild hyponatremia 134.  COVID, flu, RSV negative.  Urinalysis without evidence of infection or blood and have low suspicion for nephrolithiasis.  Patient given lidocaine  patch and Toradol  and on reevaluation symptoms are improving.  Albuterol  inhaler brought to patient's bedside.  At this time, patient symptoms consistent with unspecified viral illness.  Currently does not meet inpatient criteria for admission and will be discharged with outpatient follow-up.  Return precautions given which he voiced understanding   Additional history obtained:  -External records from outside source obtained and reviewed including: Chart review including previous notes, labs, imaging, consultation notes   Lab Tests: -I ordered, reviewed, and interpreted labs.   The pertinent results include:   Labs Reviewed  BASIC METABOLIC PANEL - Abnormal; Notable for the following components:      Result Value   Sodium 134 (*)    Glucose, Bld 106 (*)    Calcium 8.8 (*)    All other components within normal limits  URINALYSIS, ROUTINE W REFLEX MICROSCOPIC - Abnormal; Notable for the following components:   Glucose, UA >=500 (*)    All other components within normal limits  URINALYSIS, MICROSCOPIC (REFLEX) - Abnormal; Notable for the following components:   Bacteria, UA FEW (*)    All other components within normal limits  RESP PANEL BY RT-PCR (RSV, FLU A&B, COVID)  RVPGX2  CBC WITH DIFFERENTIAL/PLATELET        Imaging Studies ordered: I ordered imaging studies including chest x-ray I independently visualized and interpreted imaging. I agree with the radiologist interpretation   Medicines ordered and prescription drug management: Meds ordered this encounter  Medications   lidocaine  (LIDODERM ) 5 % 1  patch   ketorolac  (TORADOL ) 15 MG/ML injection 15 mg   albuterol  (VENTOLIN  HFA) 108 (90 Base) MCG/ACT inhaler 2 puff   naproxen  (NAPROSYN ) 375 MG tablet    Sig: Take 1 tablet (375 mg total) by mouth 2 (two) times daily.    Dispense:  20 tablet    Refill:  0   benzonatate  (TESSALON ) 100 MG capsule    Sig: Take 1 capsule (100 mg total) by mouth every 8 (eight) hours.    Dispense:  21 capsule    Refill:  0   lidocaine  (LIDODERM ) 5 %    Sig: Place 1 patch onto the skin daily. Remove & Discard patch within 12 hours or as directed by MD    Dispense:  30 patch    Refill:  0    -I have reviewed the patients home medicines and have made adjustments as needed  Critical interventions none   Social Determinants of Health:  Factors impacting patients care include: none   Reevaluation: After the interventions noted above, I reevaluated the patient and found that they have :improved  Co morbidities that complicate the patient evaluation  Past Medical History:  Diagnosis Date   Diabetes mellitus without complication (HCC)    Hypertension    IBS (irritable bowel syndrome)       Dispostion: I considered admission for this patient, but at this time he does not meet inpatient criteria for admission and will be discharged with outpatient follow-up     Final Clinical Impression(s) / ED Diagnoses Final diagnoses:  Acute cough  Acute right-sided low back pain without sciatica     @PCDICTATION @    Albertina Dixon, MD 03/23/23 [id number]

## 2023-03-23 NOTE — ED Notes (Signed)
   03/23/23 1235  Respiratory Assessment  $ RT Protocol Assessment  Yes  Assessment Type (S)  Assess only (seen before triage)  Respiratory Pattern Regular;Unlabored;Dyspnea with exertion  Chest Assessment Chest expansion symmetrical  Cough Productive  Sputum Color Green (per patient)  Bilateral Breath Sounds Clear;Diminished  Oxygen Therapy/Pulse Ox  O2 Therapy Room air  SpO2 95 %   Increased shortness of breath this am, reports productive cough with green secretions.  No pulmonary history.
